# Patient Record
Sex: Male | Born: 1955 | Race: White | Hispanic: No | Marital: Single | Smoking: Former smoker
Health system: Southern US, Community
[De-identification: ages and names within clinical notes are randomized; demographics above are authoritative.]

## PROBLEM LIST (undated history)

## (undated) DIAGNOSIS — E785 Hyperlipidemia, unspecified: Secondary | ICD-10-CM

## (undated) DIAGNOSIS — K219 Gastro-esophageal reflux disease without esophagitis: Secondary | ICD-10-CM

## (undated) DIAGNOSIS — G4730 Sleep apnea, unspecified: Secondary | ICD-10-CM

## (undated) DIAGNOSIS — I10 Essential (primary) hypertension: Secondary | ICD-10-CM

## (undated) DIAGNOSIS — G4733 Obstructive sleep apnea (adult) (pediatric): Secondary | ICD-10-CM

## (undated) HISTORY — PX: UVULOPLASTY: SHX6557

## (undated) HISTORY — PX: COLONOSCOPY: SHX174

## (undated) HISTORY — PX: HERNIA REPAIR: SHX51

## (undated) HISTORY — PX: CATARACT EXTRACTION W/ INTRAOCULAR LENS  IMPLANT, BILATERAL: SHX1307

---

## 2007-01-15 ENCOUNTER — Ambulatory Visit: Payer: Self-pay | Admitting: Gastroenterology

## 2007-05-18 ENCOUNTER — Ambulatory Visit: Payer: Self-pay | Admitting: Otolaryngology

## 2007-07-29 ENCOUNTER — Ambulatory Visit: Payer: Self-pay | Admitting: Unknown Physician Specialty

## 2007-10-28 ENCOUNTER — Ambulatory Visit: Payer: Self-pay | Admitting: Otolaryngology

## 2008-11-28 ENCOUNTER — Ambulatory Visit: Payer: Self-pay | Admitting: Family Medicine

## 2009-07-11 IMAGING — NM NM PARTHYROID
2 series · 6 of 6 positions shown · non-contrast
Comparison: none

REASON FOR EXAM: Increased PTH  252.9, elevated calcium
COMMENTS:

[Series 1000: parathyroid (id) · 0.90mm/px · 3 of 3 slices shown]
[im 1/3  full-range]
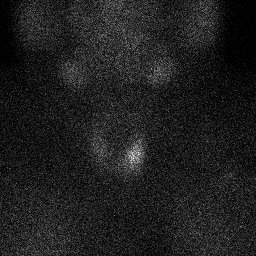
[im 2/3  full-range]
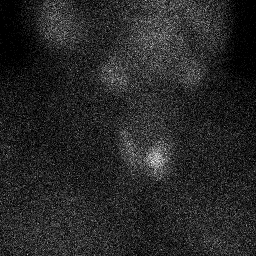
[im 3/3  full-range]
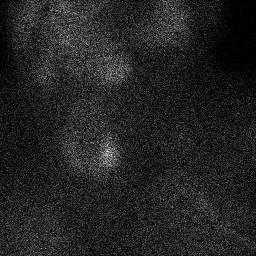

[Series 1000: parathyroid 3 hrs · 0.90mm/px · 3 of 3 slices shown]
[im 1/3  full-range]
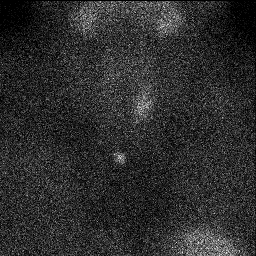
[im 2/3  full-range]
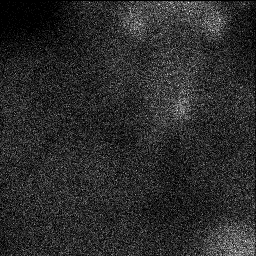
[im 3/3  full-range]
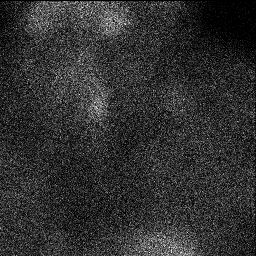

[6 of 6 positions shown; findings below may reference images not displayed]

PROCEDURE:     NM  - NM  PARATHYROID IMAGE 2 HR    [DATE] [DATE]

RESULT:     Following intravenous administration of 24.7 mCi of Technetium
99m Cardiolite, anterior images of the neck were obtained at 10 minutes and
at 3 hours. Additionally, at 3 hours a pinhole view was performed.

There is observed increased tracer activity at the lower pole of the LEFT
lobe of the thyroid. This persists throughout the exam and is consistent
with a LEFT parathyroid adenoma.
IMPRESSION: Abnormal study secondary to increased tracer activity on the
LEFT consistent with a parathyroid adenoma.

## 2010-02-04 ENCOUNTER — Emergency Department: Payer: Self-pay | Admitting: Emergency Medicine

## 2010-02-04 ENCOUNTER — Ambulatory Visit: Payer: Self-pay | Admitting: Internal Medicine

## 2011-03-02 HISTORY — PX: ROTATOR CUFF REPAIR: SHX139

## 2013-11-19 ENCOUNTER — Ambulatory Visit: Payer: Self-pay | Admitting: Gastroenterology

## 2018-03-14 ENCOUNTER — Encounter: Payer: Self-pay | Admitting: Gastroenterology

## 2019-01-20 ENCOUNTER — Telehealth: Payer: Self-pay | Admitting: Gastroenterology

## 2019-01-20 NOTE — Telephone Encounter (Signed)
Patient called & thinks it's time to schedule his colonoscopy.Please call to schedule if or call if not to let him know when he's due.

## 2019-01-22 ENCOUNTER — Encounter: Payer: Self-pay | Admitting: Gastroenterology

## 2019-01-22 ENCOUNTER — Telehealth: Payer: Self-pay

## 2019-01-22 ENCOUNTER — Other Ambulatory Visit: Payer: Self-pay

## 2019-01-22 DIAGNOSIS — Z86010 Personal history of colonic polyps: Secondary | ICD-10-CM

## 2019-01-22 NOTE — Telephone Encounter (Signed)
Gastroenterology Pre-Procedure Review Request Date: 02/24/19 Requesting Physician: Dr. Edds Norris  PATIENT REVIEW QUESTIONS: The patient responded to the following health history questions as indicated:    1. Are you having any GI issues? no 2. Do you have a personal history of Polyps? yes (2015 or 2016) 3. Do you have a family history of Colon Cancer or Polyps? yes (mother colon polyps) 4. Diabetes Mellitus? no 5. Joint replacements in the past 12 months?no 6. Major health problems in the past 3 months?no 7. Any artificial heart valves, MVP, or defibrillator?no    MEDICATIONS & ALLERGIES:    Patient reports the following regarding taking any anticoagulation/antiplatelet therapy:   Plavix, Coumadin, Eliquis, Xarelto, Lovenox, Pradaxa, Brilinta, or Effient? no Aspirin? no  Patient confirms/reports the following medications:  No current outpatient medications on file.   No current facility-administered medications for this visit.    Patient confirms/reports the following allergies:  Not on File  No orders of the defined types were placed in this encounter.   AUTHORIZATION INFORMATION Primary Insurance: 1D#: Group #:  Secondary Insurance: 1D#: Group #:  SCHEDULE INFORMATION: Date: 02/24/19 Time: Location:[facility]

## 2019-01-22 NOTE — Telephone Encounter (Signed)
Returned patients phone call.  LVM for him to call office back to schedule his colonoscopy.  Scheduling Notes: Last Colonoscopy 2015 with Dr. Butkus Norris, Colon Polyps noted.  Was due to repeat in 3 years.  New Referral placed.  Thanks [employer]

## 2019-02-18 ENCOUNTER — Encounter: Payer: Self-pay | Admitting: Gastroenterology

## 2019-02-18 ENCOUNTER — Other Ambulatory Visit: Payer: Self-pay

## 2019-02-20 ENCOUNTER — Other Ambulatory Visit
Admission: RE | Admit: 2019-02-20 | Discharge: 2019-02-20 | Disposition: A | Discharge status: Home / Self Care | Payer: 59 | Source: Ambulatory Visit | Attending: Gastroenterology | Admitting: Gastroenterology

## 2019-02-20 DIAGNOSIS — Z01812 Encounter for preprocedural laboratory examination: Secondary | ICD-10-CM | POA: Insufficient documentation

## 2019-02-20 DIAGNOSIS — Z20822 Contact with and (suspected) exposure to covid-19: Secondary | ICD-10-CM | POA: Diagnosis not present

## 2019-02-20 LAB — SARS CORONAVIRUS 2 (TAT 6-24 HRS): SARS Coronavirus 2: NEGATIVE

## 2019-02-23 NOTE — Discharge Instructions (Signed)
General Anesthesia, Adult, Care After This sheet gives you information about how to care for yourself after your procedure. Your health care provider may also give you more specific instructions. If you have problems or questions, contact your health care provider. What can I expect after the procedure? After the procedure, the following side effects are common:  Pain or discomfort at the IV site.  Nausea.  Vomiting.  Sore throat.  Trouble concentrating.  Feeling cold or chills.  Weak or tired.  Sleepiness and fatigue.  Soreness and body aches. These side effects can affect parts of the body that were not involved in surgery. Follow these instructions at home:  For at least 24 hours after the procedure:  Have a responsible adult stay with you. It is important to have someone help care for you until you are awake and alert.  Rest as needed.  Do not: ? Participate in activities in which you could fall or become injured. ? Drive. ? Use heavy machinery. ? Drink alcohol. ? Take sleeping pills or medicines that cause drowsiness. ? Make important decisions or sign legal documents. ? Take care of children on your own. Eating and drinking  Follow any instructions from your health care provider about eating or drinking restrictions.  When you feel hungry, start by eating small amounts of foods that are soft and easy to digest (bland), such as toast. Gradually return to your regular diet.  Drink enough fluid to keep your urine pale yellow.  If you vomit, rehydrate by drinking water, juice, or clear broth. General instructions  If you have sleep apnea, surgery and certain medicines can increase your risk for breathing problems. Follow instructions from your health care provider about wearing your sleep device: ? Anytime you are sleeping, including during daytime naps. ? While taking prescription pain medicines, sleeping medicines, or medicines that make you drowsy.  Return to  your normal activities as told by your health care provider. Ask your health care provider what activities are safe for you.  Take over-the-counter and prescription medicines only as told by your health care provider.  If you smoke, do not smoke without supervision.  Keep all follow-up visits as told by your health care provider. This is important. Contact a health care provider if:  You have nausea or vomiting that does not get better with medicine.  You cannot eat or drink without vomiting.  You have pain that does not get better with medicine.  You are unable to pass urine.  You develop a skin rash.  You have a fever.  You have redness around your IV site that gets worse. Get help right away if:  You have difficulty breathing.  You have chest pain.  You have blood in your urine or stool, or you vomit blood. Summary  After the procedure, it is common to have a sore throat or nausea. It is also common to feel tired.  Have a responsible adult stay with you for the first 24 hours after general anesthesia. It is important to have someone help care for you until you are awake and alert.  When you feel hungry, start by eating small amounts of foods that are soft and easy to digest (bland), such as toast. Gradually return to your regular diet.  Drink enough fluid to keep your urine pale yellow.  Return to your normal activities as told by your health care provider. Ask your health care provider what activities are safe for you. This information is not   intended to replace advice given to you by your health care provider. Make sure you discuss any questions you have with your health care provider. Document Revised: 12/21/2016 Document Reviewed: 08/03/2016 Elsevier Patient Education  2020 [employer]vier Inc[employer]  

## 2019-02-24 ENCOUNTER — Other Ambulatory Visit: Payer: Self-pay

## 2019-02-24 ENCOUNTER — Ambulatory Visit: Payer: 59 | Admitting: Anesthesiology

## 2019-02-24 ENCOUNTER — Encounter: Payer: Self-pay | Admitting: Gastroenterology

## 2019-02-24 ENCOUNTER — Encounter
Admission: RE | Disposition: A | Discharge status: Home / Self Care | Payer: Self-pay | Source: Home / Self Care | Attending: Gastroenterology

## 2019-02-24 ENCOUNTER — Ambulatory Visit
Admission: RE | Admit: 2019-02-24 | Discharge: 2019-02-24 | Disposition: A | Discharge status: Home / Self Care | Payer: 59 | Attending: Gastroenterology | Admitting: Gastroenterology

## 2019-02-24 DIAGNOSIS — Z87891 Personal history of nicotine dependence: Secondary | ICD-10-CM | POA: Diagnosis not present

## 2019-02-24 DIAGNOSIS — K635 Polyp of colon: Secondary | ICD-10-CM | POA: Diagnosis not present

## 2019-02-24 DIAGNOSIS — Z7982 Long term (current) use of aspirin: Secondary | ICD-10-CM | POA: Diagnosis not present

## 2019-02-24 DIAGNOSIS — Z79899 Other long term (current) drug therapy: Secondary | ICD-10-CM | POA: Diagnosis not present

## 2019-02-24 DIAGNOSIS — K5730 Diverticulosis of large intestine without perforation or abscess without bleeding: Secondary | ICD-10-CM | POA: Insufficient documentation

## 2019-02-24 DIAGNOSIS — H409 Unspecified glaucoma: Secondary | ICD-10-CM | POA: Diagnosis not present

## 2019-02-24 DIAGNOSIS — Z6830 Body mass index (BMI) 30.0-30.9, adult: Secondary | ICD-10-CM | POA: Diagnosis not present

## 2019-02-24 DIAGNOSIS — K219 Gastro-esophageal reflux disease without esophagitis: Secondary | ICD-10-CM | POA: Diagnosis not present

## 2019-02-24 DIAGNOSIS — G4730 Sleep apnea, unspecified: Secondary | ICD-10-CM | POA: Insufficient documentation

## 2019-02-24 DIAGNOSIS — Z860100 Personal history of colon polyps, unspecified: Secondary | ICD-10-CM

## 2019-02-24 DIAGNOSIS — I10 Essential (primary) hypertension: Secondary | ICD-10-CM | POA: Diagnosis not present

## 2019-02-24 DIAGNOSIS — D124 Benign neoplasm of descending colon: Secondary | ICD-10-CM | POA: Diagnosis not present

## 2019-02-24 DIAGNOSIS — Z1211 Encounter for screening for malignant neoplasm of colon: Secondary | ICD-10-CM | POA: Insufficient documentation

## 2019-02-24 DIAGNOSIS — K641 Second degree hemorrhoids: Secondary | ICD-10-CM | POA: Diagnosis not present

## 2019-02-24 DIAGNOSIS — D125 Benign neoplasm of sigmoid colon: Secondary | ICD-10-CM | POA: Insufficient documentation

## 2019-02-24 HISTORY — DX: Sleep apnea, unspecified: G47.30

## 2019-02-24 HISTORY — PX: POLYPECTOMY: SHX5525

## 2019-02-24 HISTORY — DX: Hyperlipidemia, unspecified: E78.5

## 2019-02-24 HISTORY — DX: Essential (primary) hypertension: I10

## 2019-02-24 HISTORY — DX: Gastro-esophageal reflux disease without esophagitis: K21.9

## 2019-02-24 HISTORY — PX: COLONOSCOPY WITH PROPOFOL: SHX5780

## 2019-02-24 SURGERY — COLONOSCOPY WITH PROPOFOL
Anesthesia: General | Site: Rectum

## 2019-02-24 MED ORDER — LIDOCAINE HCL (CARDIAC) PF 100 MG/5ML IV SOSY
PREFILLED_SYRINGE | INTRAVENOUS | Status: DC | PRN
  Administered 2019-02-24: 30 mg via INTRAVENOUS

## 2019-02-24 MED ORDER — PROPOFOL 10 MG/ML IV BOLUS
INTRAVENOUS | Status: DC | PRN
  Administered 2019-02-24: 100 mg via INTRAVENOUS

## 2019-02-24 MED ORDER — STERILE WATER FOR IRRIGATION IR SOLN
Status: DC | PRN
  Administered 2019-02-24: 50 mL

## 2019-02-24 MED ORDER — LACTATED RINGERS IV SOLN: INTRAVENOUS | Status: DC

## 2019-02-24 SURGICAL SUPPLY — 7 items
CANISTER SUCT 1200ML W/VALVE (MISCELLANEOUS) ×3 IMPLANT
GOWN CVR UNV OPN BCK APRN NK (MISCELLANEOUS) ×2 IMPLANT
GOWN ISOL THUMB LOOP REG UNIV (MISCELLANEOUS) ×4
KIT ENDO PROCEDURE OLY (KITS) ×3 IMPLANT
SNARE SHORT THROW 13M SML OVAL (MISCELLANEOUS) ×2 IMPLANT
TRAP ETRAP POLY (MISCELLANEOUS) ×2 IMPLANT
WATER STERILE IRR 250ML POUR (IV SOLUTION) ×3 IMPLANT

## 2019-02-24 NOTE — Transfer of Care (Signed)
Immediate Anesthesia Transfer of Care Note  Patient: Alexander Brewer  Procedure(s) Performed: COLONOSCOPY WITH BIOPSY (N/A Rectum) POLYPECTOMY (N/A Rectum)  Patient Location: PACU  Anesthesia Type: General  Level of Consciousness: awake, alert  and patient cooperative  Airway and Oxygen Therapy: Patient Spontanous Breathing and Patient connected to supplemental oxygen  Post-op Assessment: Post-op Vital signs reviewed, Patient's Cardiovascular Status Stable, Respiratory Function Stable, Patent Airway and No signs of Nausea or vomiting  Post-op Vital Signs: Reviewed and stable  Complications: No apparent anesthesia complications

## 2019-02-24 NOTE — Anesthesia Procedure Notes (Signed)
Performed by: Teige Rountree, CRNA Pre-anesthesia Checklist: Patient identified, Emergency Drugs available, Suction available, Timeout performed and Patient being monitored Patient Re-evaluated:Patient Re-evaluated prior to induction Oxygen Delivery Method: Nasal cannula Placement Confirmation: positive ETCO2       

## 2019-02-24 NOTE — Anesthesia Postprocedure Evaluation (Signed)
Anesthesia Post Note  Patient: Alexander Brewer  Procedure(s) Performed: COLONOSCOPY WITH BIOPSY (N/A Rectum) POLYPECTOMY (N/A Rectum)     Patient location during evaluation: PACU Anesthesia Type: General Level of consciousness: awake and alert Pain management: pain level controlled Vital Signs Assessment: post-procedure vital signs reviewed and stable Respiratory status: spontaneous breathing, nonlabored ventilation, respiratory function stable and patient connected to nasal cannula oxygen Cardiovascular status: blood pressure returned to baseline and stable Postop Assessment: no apparent nausea or vomiting Anesthetic complications: no    Matthan Sledge

## 2019-02-24 NOTE — H&P (Signed)
Alexander Lame, MD [facility] [address], [place], [place] [address] [phone] Fax : [phone]  Primary Care Physician:  Alexander Hartigan, MD Primary Gastroenterologist:  Dr. Geiger Brewer  Pre-Procedure History & Physical: HPI:  Alexander Brewer is a 64 y.o. male is here for an colonoscopy.   Past Medical History:  Diagnosis Date  . GERD (gastroesophageal reflux disease)   . Hyperlipidemia   . Hypertension   . Sleep apnea    CPAP    Past Surgical History:  Procedure Laterality Date  . CATARACT EXTRACTION W/ INTRAOCULAR LENS  IMPLANT, BILATERAL    . COLONOSCOPY    . HERNIA REPAIR    . ROTATOR CUFF REPAIR  03/2011  . UVULOPLASTY      Prior to Admission medications   Medication Sig Start Date End Date Taking? Authorizing Provider  amLODipine (NORVASC) 5 MG tablet Take 5 mg by mouth daily.   Yes [provider]  ASPIRIN 81 PO Take by mouth daily.   Yes [provider]  dorzolamide-timolol (COSOPT) 22.3-6.8 MG/ML ophthalmic solution Place 1 drop into both eyes 2 (two) times daily.   Yes [provider]  GARLIC PO Take by mouth daily.   Yes [provider]  Ixekizumab (TALTZ Turin) Inject into the skin every 30 (thirty) days.   Yes [provider]  lisinopril (ZESTRIL) 40 MG tablet Take 40 mg by mouth daily.   Yes [provider]  metoprolol tartrate (LOPRESSOR) 50 MG tablet Take 50 mg by mouth 2 (two) times daily.   Yes [provider]  naproxen sodium (ALEVE) 220 MG tablet Take 220 mg by mouth 2 (two) times daily as needed.   Yes [provider]  omeprazole (PRILOSEC) 20 MG capsule Take 20 mg by mouth daily.   Yes [provider]  Probiotic Product (PROBIOTIC PO) Take by mouth daily.   Yes [provider]  traZODone (DESYREL) 50 MG tablet Take 50 mg by mouth at bedtime as needed for sleep.   Yes [provider]  zolpidem (AMBIEN CR) 6.25 MG CR tablet Take 6.25 mg by mouth at  bedtime as needed for sleep.   Yes [provider]  Omega-3 Fatty Acids (OMEGA-3 FISH OIL PO) Take by mouth daily.    [provider]  sildenafil (VIAGRA) 100 MG tablet Take 100 mg by mouth daily as needed for erectile dysfunction.    [provider]    Allergies as of 01/22/2019  . (Not on File)    History reviewed. No pertinent family history.  Social History   Socioeconomic History  . Marital status: Single    Spouse name: Not on file  . Number of children: Not on file  . Years of education: Not on file  . Highest education level: Not on file  Occupational History  . Not on file  Tobacco Use  . Smoking status: Former Smoker    Quit date: 1990    Years since quitting: 31.1  . Smokeless tobacco: Never Used  Substance and Sexual Activity  . Alcohol use: Not Currently  . Drug use: Not on file  . Sexual activity: Not on file  Other Topics Concern  . Not on file  Social History Narrative  . Not on file   Social Determinants of Health   Financial Resource Strain:   . Difficulty of Paying Living Expenses: Not on file  Food Insecurity:   . Worried About Charity fundraiser in the Last Year:  Not on file  . Ran Out of Food in the Last Year: Not on file  Transportation Needs:   . Lack of Transportation (Medical): Not on file  . Lack of Transportation (Non-Medical): Not on file  Physical Activity:   . Days of Exercise per Week: Not on file  . Minutes of Exercise per Session: Not on file  Stress:   . Feeling of Stress : Not on file  Social Connections:   . Frequency of Communication with Friends and Family: Not on file  . Frequency of Social Gatherings with Friends and Family: Not on file  . Attends Religious Services: Not on file  . Active Member of Clubs or Organizations: Not on file  . Attends Archivist Meetings: Not on file  . Marital Status: Not on file  Intimate Partner Violence:   . Fear of Current or Ex-Partner: Not on file    . Emotionally Abused: Not on file  . Physically Abused: Not on file  . Sexually Abused: Not on file    Review of Systems: See HPI, otherwise negative ROS  Physical Exam: Ht 6' (1.829 m)   Wt 102.5 kg   BMI 30.65 kg/m  General:   Alert,  pleasant and cooperative in NAD Head:  Normocephalic and atraumatic. Neck:  Supple; no masses or thyromegaly. Lungs:  Clear throughout to auscultation.    Heart:  Regular rate and rhythm. Abdomen:  Soft, nontender and nondistended. Normal bowel sounds, without guarding, and without rebound.   Neurologic:  Alert and  oriented x4;  grossly normal neurologically.  Impression/Plan: Alexander Brewer is here for an colonoscopy to be performed for history of adenomaotus polyps last colonoscopy 11/2013  Risks, benefits, limitations, and alternatives regarding  colonoscopy have been reviewed with the patient.  Questions have been answered.  All parties agreeable.   Alexander Lame, MD  02/24/2019, 9:17 AM

## 2019-02-24 NOTE — Op Note (Signed)
[facility] Gastroenterology Patient Name: Alexander Brewer Procedure Date: 02/24/2019 9:52 AM MRN: [id number] Account #: [id number] Date of Birth: 10-06-1955 Admit Type: Outpatient Age: 64 Room: [facility] OR ROOM 01 Gender: Male Note Status: Finalized Procedure:             Colonoscopy Indications:           High risk colon cancer surveillance: Personal history                         of colonic polyps Providers:             Lucilla Lame MD, MD Referring MD:          Sofie Hartigan (Referring MD) Medicines:             Propofol per Anesthesia Complications:         No immediate complications. Procedure:             Pre-Anesthesia Assessment:                        - Prior to the procedure, a History and Physical was                         performed, and patient medications and allergies were                         reviewed. The patient's tolerance of previous                         anesthesia was also reviewed. The risks and benefits                         of the procedure and the sedation options and risks                         were discussed with the patient. All questions were                         answered, and informed consent was obtained. Prior                         Anticoagulants: The patient has taken no previous                         anticoagulant or antiplatelet agents. ASA Grade                         Assessment: II - A patient with mild systemic disease.                         After reviewing the risks and benefits, the patient                         was deemed in satisfactory condition to undergo the                         procedure.  After obtaining informed consent, the colonoscope was                         passed under direct vision. Throughout the procedure,                         the patient's blood pressure, pulse, and oxygen                         saturations were monitored continuously. The was               introduced through the anus and advanced to the the                         cecum, identified by appendiceal orifice and ileocecal                         valve. The colonoscopy was performed without                         difficulty. The patient tolerated the procedure well.                         The quality of the bowel preparation was excellent. Findings:      The perianal and digital rectal examinations were normal.      A 6 mm polyp was found in the descending colon. The polyp was sessile.       The polyp was removed with a cold snare. Resection and retrieval were       complete.      Four sessile polyps were found in the sigmoid colon. The polyps were 4       to 7 mm in size. These polyps were removed with a cold snare. Resection       and retrieval were complete.      Multiple small-mouthed diverticula were found in the entire colon.      Non-bleeding internal hemorrhoids were found during retroflexion. The       hemorrhoids were Grade II (internal hemorrhoids that prolapse but reduce       spontaneously). Impression:            - One 6 mm polyp in the descending colon, removed with                         a cold snare. Resected and retrieved.                        - Four 4 to 7 mm polyps in the sigmoid colon, removed                         with a cold snare. Resected and retrieved.                        - Diverticulosis in the entire examined colon.                        - Non-bleeding internal hemorrhoids. Recommendation:        - Discharge patient to home.                        -  Resume previous diet.                        - Continue present medications.                        - Await pathology results.                        - Repeat colonoscopy in 5 years for surveillance. Procedure Code(s):     --- Professional ---                        [phone], Colonoscopy, flexible; with removal of                         tumor(s), polyp(s), or other lesion(s) by snare                          technique Diagnosis Code(s):     --- Professional ---                        Z86.010, Personal history of colonic polyps                        K63.5, Polyp of colon CPT copyright 2019 American Medical Association. All rights reserved. The codes documented in this report are preliminary and upon coder review may  be revised to meet current compliance requirements. Lucilla Lame MD, MD 02/24/2019 10:19:58 AM This report has been signed electronically. Number of Addenda: 0 Note Initiated On: 02/24/2019 9:52 AM Scope Withdrawal Time: 0 hours 11 minutes 50 seconds  Total Procedure Duration: 0 hours 13 minutes 52 seconds  Estimated Blood Loss:  Estimated blood loss: none.      [facility]

## 2019-02-24 NOTE — Anesthesia Preprocedure Evaluation (Signed)
Anesthesia Evaluation  Patient identified by MRN, date of birth, ID band Patient awake    Reviewed: NPO status   History of Anesthesia Complications Negative for: history of anesthetic complications  Airway Mallampati: II  TM Distance: >3 FB Neck ROM: full    Dental no notable dental hx.    Pulmonary sleep apnea and Continuous Positive Airway Pressure Ventilation , former smoker,    Pulmonary exam normal        Cardiovascular Exercise Tolerance: Good hypertension, Normal cardiovascular exam     Neuro/Psych Glaucoma  negative neurological ROS  negative psych ROS   GI/Hepatic Neg liver ROS, Controlled,  Endo/Other  Morbid obesity (bmi 31)  Renal/GU negative Renal ROS  negative genitourinary   Musculoskeletal psoriasis   Abdominal   Peds  Hematology negative hematology ROS (+)   Anesthesia Other Findings covid NEG.  Reproductive/Obstetrics                             Anesthesia Physical Anesthesia Plan  ASA: II  Anesthesia Plan: General   Post-op Pain Management:    Induction:   PONV Risk Score and Plan: 2 and TIVA and Propofol infusion  Airway Management Planned:   Additional Equipment:   Intra-op Plan:   Post-operative Plan:   Informed Consent: I have reviewed the patients History and Physical, chart, labs and discussed the procedure including the risks, benefits and alternatives for the proposed anesthesia with the patient or authorized representative who has indicated his/her understanding and acceptance.       Plan Discussed with: CRNA  Anesthesia Plan Comments:         Anesthesia Quick Evaluation

## 2019-02-25 ENCOUNTER — Encounter: Payer: Self-pay | Admitting: *Deleted

## 2019-02-26 ENCOUNTER — Encounter: Payer: Self-pay | Admitting: Gastroenterology

## 2019-02-26 LAB — SURGICAL PATHOLOGY

## 2021-09-19 ENCOUNTER — Encounter: Payer: Self-pay | Admitting: Oncology

## 2021-09-19 ENCOUNTER — Inpatient Hospital Stay: Payer: Medicare Other | Attending: Oncology | Admitting: Oncology

## 2021-09-19 ENCOUNTER — Inpatient Hospital Stay: Payer: Medicare Other

## 2021-09-19 VITALS — BP 145/89 | HR 56 | Temp 96.8°F | Resp 18 | Wt 221.8 lb

## 2021-09-19 DIAGNOSIS — I10 Essential (primary) hypertension: Secondary | ICD-10-CM | POA: Insufficient documentation

## 2021-09-19 DIAGNOSIS — K219 Gastro-esophageal reflux disease without esophagitis: Secondary | ICD-10-CM | POA: Diagnosis not present

## 2021-09-19 DIAGNOSIS — Z7982 Long term (current) use of aspirin: Secondary | ICD-10-CM | POA: Insufficient documentation

## 2021-09-19 DIAGNOSIS — Z8349 Family history of other endocrine, nutritional and metabolic diseases: Secondary | ICD-10-CM | POA: Insufficient documentation

## 2021-09-19 DIAGNOSIS — Z832 Family history of diseases of the blood and blood-forming organs and certain disorders involving the immune mechanism: Secondary | ICD-10-CM | POA: Insufficient documentation

## 2021-09-19 DIAGNOSIS — G4730 Sleep apnea, unspecified: Secondary | ICD-10-CM | POA: Insufficient documentation

## 2021-09-19 DIAGNOSIS — R7303 Prediabetes: Secondary | ICD-10-CM | POA: Diagnosis not present

## 2021-09-19 DIAGNOSIS — R7989 Other specified abnormal findings of blood chemistry: Secondary | ICD-10-CM | POA: Diagnosis present

## 2021-09-19 DIAGNOSIS — Z79899 Other long term (current) drug therapy: Secondary | ICD-10-CM | POA: Diagnosis not present

## 2021-09-19 DIAGNOSIS — Z87891 Personal history of nicotine dependence: Secondary | ICD-10-CM | POA: Diagnosis not present

## 2021-09-19 LAB — IRON AND TIBC
Iron: 226 ug/dL — ABNORMAL HIGH (ref 45–182)
Saturation Ratios: 89 % — ABNORMAL HIGH (ref 17.9–39.5)
TIBC: 253 ug/dL (ref 250–450)
UIBC: 27 ug/dL

## 2021-09-19 LAB — FERRITIN: Ferritin: 294 ng/mL (ref 24–336)

## 2021-09-19 NOTE — Assessment & Plan Note (Addendum)
Family history of hemochromatosis. Recommend to repeat iron panel today.  Also check hemochromatosis DNA screening panel. Recommend patient to stop vitamin C supplementation.  Avoid alcohol use.

## 2021-09-19 NOTE — Assessment & Plan Note (Signed)
Pending above workup. 

## 2021-09-19 NOTE — Progress Notes (Signed)
Hematology/Oncology Consult note [phone] [phone]         Patient Care Team: Sofie Hartigan, MD as PCP - General (Family Medicine)  REFERRING PROVIDER: Sofie Hartigan, MD   CHIEF COMPLAINTS/REASON FOR VISIT:  Evaluation of elevated ferritin.  HISTORY OF PRESENTING ILLNESS:   Alexander Brewer. is a  66 y.o.  male with PMH listed below was seen in consultation at the request of  Feldpausch, Chrissie Noa, MD  for evaluation of elevated ferritin.  Patient reports family history of hemochromatosis in her sister.  Recent blood work on 09/08/2021 showed ferritin level of 359, iron saturation 98%, TIBC 257.  Calcium 10.8.  Normal AST, ALT, alkaline phosphatase and bilirubin. Patient denies any current alcohol use.  Patient used to drink alcohol heavily in 20s.  Denies oral iron supplementation.  He takes vitamin C 500 mg twice daily for the past year and a half. Patient is prediabetic.  No abdominal pain, unintentional weight loss, bloating.  MEDICAL HISTORY:  Past Medical History:  Diagnosis Date   GERD (gastroesophageal reflux disease)    Hypertension    Sleep apnea    CPAP    SURGICAL HISTORY: Past Surgical History:  Procedure Laterality Date   CATARACT EXTRACTION W/ INTRAOCULAR LENS  IMPLANT, BILATERAL     COLONOSCOPY     COLONOSCOPY WITH PROPOFOL N/A 02/24/2019   Procedure: COLONOSCOPY WITH BIOPSY;  Surgeon: Lucilla Lame, MD;  Location: [place];  Service: Endoscopy;  Laterality: N/A;  Sleep apnea Priority 4   HERNIA REPAIR     POLYPECTOMY N/A 02/24/2019   Procedure: POLYPECTOMY;  Surgeon: Lucilla Lame, MD;  Location: [place];  Service: Endoscopy;  Laterality: N/A;   ROTATOR CUFF REPAIR  03/2011   UVULOPLASTY      SOCIAL HISTORY: Social History   Socioeconomic History   Marital status: Single    Spouse name: Not on file   Number of children: Not on file   Years of education: Not on file   Highest education level:  Not on file  Occupational History   Not on file  Tobacco Use   Smoking status: Former    Packs/day: 0.50    Years: 3.00    Total pack years: 1.50    Types: Cigarettes    Quit date: 64    Years since quitting: 33.7   Smokeless tobacco: Never  Vaping Use   Vaping Use: Never used  Substance and Sexual Activity   Alcohol use: Not Currently   Drug use: Never   Sexual activity: Not on file  Other Topics Concern   Not on file  Social History Narrative   Not on file   Social Determinants of Health   Financial Resource Strain: Not on file  Food Insecurity: Not on file  Transportation Needs: Not on file  Physical Activity: Not on file  Stress: Not on file  Social Connections: Not on file  Intimate Partner Violence: Not on file    FAMILY HISTORY: Family History  Problem Relation Age of Onset   Breast cancer Mother    Heart failure Mother    Breast cancer Sister     ALLERGIES:  has No Known Allergies.  MEDICATIONS:  Current Outpatient Medications  Medication Sig Dispense Refill   amLODipine (NORVASC) 5 MG tablet Take 5 mg by mouth daily.     ASPIRIN 81 PO Take by mouth daily.     atorvastatin (LIPITOR) 10 MG tablet Take 10 mg by mouth  daily.     dorzolamide-timolol (COSOPT) 22.3-6.8 MG/ML ophthalmic solution Place 1 drop into both eyes 2 (two) times daily.     GARLIC PO Take by mouth daily.     Ixekizumab (TALTZ [place]) Inject into the skin every 30 (thirty) days.     lisinopril (ZESTRIL) 40 MG tablet Take 40 mg by mouth daily.     melatonin 5 MG TABS Take 5 mg by mouth.     metoprolol tartrate (LOPRESSOR) 50 MG tablet Take 50 mg by mouth 2 (two) times daily.     naproxen sodium (ALEVE) 220 MG tablet Take 220 mg by mouth 2 (two) times daily as needed.     omeprazole (PRILOSEC) 20 MG capsule Take 20 mg by mouth daily.     sildenafil (VIAGRA) 100 MG tablet Take 100 mg by mouth daily as needed for erectile dysfunction.     TURMERIC CURCUMIN PO Take by mouth.     zolpidem  (AMBIEN CR) 6.25 MG CR tablet Take 6.25 mg by mouth at bedtime as needed for sleep.     No current facility-administered medications for this visit.    Review of Systems  Constitutional:  Negative for appetite change, chills, fatigue, fever and unexpected weight change.  HENT:   Negative for hearing loss and voice change.   Eyes:  Negative for eye problems and icterus.  Respiratory:  Negative for chest tightness, cough and shortness of breath.   Cardiovascular:  Negative for chest pain and leg swelling.  Gastrointestinal:  Negative for abdominal distention and abdominal pain.  Endocrine: Negative for hot flashes.  Genitourinary:  Negative for difficulty urinating, dysuria and frequency.   Musculoskeletal:  Negative for arthralgias.  Skin:  Negative for itching and rash.  Neurological:  Negative for light-headedness and numbness.  Hematological:  Negative for adenopathy. Does not bruise/bleed easily.  Psychiatric/Behavioral:  Negative for confusion.    PHYSICAL EXAMINATION: ECOG PERFORMANCE STATUS: 0 - Asymptomatic Vitals:   09/19/21 1045  BP: (!) 145/89  Pulse: (!) 56  Resp: 18  Temp: (!) 96.8 F (36 C)   Filed Weights   09/19/21 1045  Weight: 221 lb 12.8 oz (100.6 kg)    Physical Exam Constitutional:      General: He is not in acute distress. HENT:     Head: Normocephalic and atraumatic.  Eyes:     General: No scleral icterus. Cardiovascular:     Rate and Rhythm: Normal rate and regular rhythm.     Heart sounds: Normal heart sounds.  Pulmonary:     Effort: Pulmonary effort is normal. No respiratory distress.     Breath sounds: No wheezing.  Abdominal:     General: Bowel sounds are normal. There is no distension.     Palpations: Abdomen is soft.  Musculoskeletal:        General: No deformity. Normal range of motion.     Cervical back: Normal range of motion and neck supple.  Skin:    General: Skin is warm and dry.     Findings: No erythema or rash.   Neurological:     Mental Status: He is alert and oriented to person, place, and time. Mental status is at baseline.     Cranial Nerves: No cranial nerve deficit.     Coordination: Coordination normal.  Psychiatric:        Mood and Affect: Mood normal.     LABORATORY DATA:  I have reviewed the data as listed     No data  to display             No data to display         09/14/2021, hepatitis panel negative.   RADIOGRAPHIC STUDIES: I have personally reviewed the radiological images as listed and agreed with the findings in the report. No results found.     ASSESSMENT & PLAN:   Elevated ferritin Family history of hemochromatosis. Recommend to repeat iron panel today.  Also check hemochromatosis DNA screening panel. Recommend patient to stop vitamin C supplementation.  Family history of hemochromatosis Pending above work-up.   Orders Placed This Encounter  Procedures   Ferritin    Standing Status:   Future    Number of Occurrences:   1    Standing Expiration Date:   03/20/2022   Iron and TIBC    Standing Status:   Future    Number of Occurrences:   1    Standing Expiration Date:   09/20/2022   Hemochromatosis DNA-PCR(c282y,h63d)    Standing Status:   Future    Number of Occurrences:   1    Standing Expiration Date:   09/20/2022    All questions were answered. The patient knows to call the clinic with any problems, questions or concerns.  Sofie Hartigan, MD   Return of visit: 3 to 4 weeks to go over results. Thank you for this kind referral and the opportunity to participate in the care of this patient. A copy of today's note is routed to referring provider   Earlie Server, MD, PhD [facility] Hematology Oncology 09/19/2021

## 2021-09-25 LAB — HEMOCHROMATOSIS DNA-PCR(C282Y,H63D)

## 2021-09-27 ENCOUNTER — Telehealth: Payer: Self-pay

## 2021-09-27 DIAGNOSIS — R7989 Other specified abnormal findings of blood chemistry: Secondary | ICD-10-CM

## 2021-09-27 DIAGNOSIS — Z8349 Family history of other endocrine, nutritional and metabolic diseases: Secondary | ICD-10-CM

## 2021-09-27 NOTE — Telephone Encounter (Signed)
-----   Message from Earlie Server, MD sent at 09/26/2021  4:10 PM EDT ----- Lab results positive for hemachromatosis, a condition that his body absorbs too much iron. Please let him know that I recommend abdomen US RUQ to further evaluate liver condition. Please add phelbotomy when he sees me in Oct. Thanks.

## 2021-09-27 NOTE — Telephone Encounter (Signed)
Pt informed of plan and verbalized understanding.   Please schedule patient for [place] RUQ (to be before appt in October) and inform pt of appt.   Add phleb to appt in OCT

## 2021-10-09 ENCOUNTER — Ambulatory Visit
Admission: RE | Admit: 2021-10-09 | Discharge: 2021-10-09 | Disposition: A | Discharge status: Home / Self Care | Payer: Medicare Other | Source: Ambulatory Visit | Attending: Oncology | Admitting: Oncology

## 2021-10-09 DIAGNOSIS — Z8349 Family history of other endocrine, nutritional and metabolic diseases: Secondary | ICD-10-CM | POA: Diagnosis present

## 2021-10-09 DIAGNOSIS — R7989 Other specified abnormal findings of blood chemistry: Secondary | ICD-10-CM | POA: Insufficient documentation

## 2021-10-16 ENCOUNTER — Inpatient Hospital Stay: Payer: Medicare Other | Attending: Oncology | Admitting: Oncology

## 2021-10-16 ENCOUNTER — Encounter: Payer: Self-pay | Admitting: Oncology

## 2021-10-16 ENCOUNTER — Inpatient Hospital Stay: Payer: Medicare Other

## 2021-10-16 VITALS — BP 147/96 | HR 53 | Temp 97.0°F | Resp 18 | Wt 223.0 lb

## 2021-10-16 DIAGNOSIS — Z8349 Family history of other endocrine, nutritional and metabolic diseases: Secondary | ICD-10-CM

## 2021-10-16 DIAGNOSIS — K760 Fatty (change of) liver, not elsewhere classified: Secondary | ICD-10-CM | POA: Diagnosis not present

## 2021-10-16 DIAGNOSIS — R7989 Other specified abnormal findings of blood chemistry: Secondary | ICD-10-CM | POA: Diagnosis not present

## 2021-10-16 NOTE — Progress Notes (Signed)
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  Advised patient to have first-degree relative screening for hemochromatosis.  Avoid alcohol consumption.  Avoid uncooked seafood. Patient voices understanding.      Elevated ferritin Iron saturation is persistently elevated.  Ferritin 200s. Recommend empiric phlebotomy 536m x 1  Fatty liver Ultrasound results were reviewed and discussed with patient.  Encourage lifestyle modification.  Increase exercise, healthy diet   Orders Placed This Encounter  Procedures   Ferritin    Standing Status:   Future    Standing Expiration Date:   10/17/2022   Iron and TIBC    Standing Status:   Future    Standing Expiration Date:   10/17/2022   CBC with Differential/Platelet    Standing Status:   Future    Standing Expiration Date:   10/17/2022   Hepatic function panel    Standing Status:   Future    Standing Expiration Date:   10/16/2022   Follow-up in 3 months All questions were answered. The patient knows to call the clinic with any problems, questions or concerns.  ZEarlie Server MD, PhD [facility] On[facility] 10/16/2021    CHIEF COMPLAINTS/REASON FOR VISIT:  Follow-up for iron overload, homozygous hemochromatosis  HISTORY OF PRESENTING ILLNESS:   Alexander Brewer is a  66y.o.  male with PMH listed below was seen in consultation at the request of  Feldpausch, DChrissie Noa MD  for evaluation of  iron overload, homozygous hemochromatosis  Patient reports family history of hemochromatosis in her sister.  Recent blood work on 09/08/2021 showed ferritin level of 359, iron saturation 98%, TIBC 257.  Calcium 10.8.  Normal AST, ALT, alkaline phosphatase and bilirubin. Patient denies any current alcohol  use.  Patient used to drink alcohol heavily in 20s.  Denies oral iron supplementation.  He takes vitamin C 500 mg twice daily for the past year and a half. Patient is prediabetic.  No abdominal pain, unintentional weight loss, bloating.  INTERVAL HISTORY Alexander Brewer is a 66y.o. male who has above history reviewed by me today presents for follow up visit for  iron overload, homozygous hemochromatosis. Patient had blood work done and present to discuss results.  He also had ultrasound abdomen done.  MEDICAL HISTORY:  Past Medical History:  Diagnosis Date   GERD (gastroesophageal reflux disease)    Hypertension    Sleep apnea    CPAP    SURGICAL HISTORY: Past Surgical History:  Procedure Laterality Date   CATARACT EXTRACTION W/ INTRAOCULAR LENS  IMPLANT, BILATERAL     COLONOSCOPY     COLONOSCOPY WITH PROPOFOL N/A 02/24/2019   Procedure: COLONOSCOPY WITH BIOPSY;  Surgeon: WLucilla Lame MD;  [place]  Service: Endoscopy;  Laterality: N/A;  Sleep apnea Priority 4   HERNIA REPAIR     POLYPECTOMY N/A 02/24/2019   Procedure: POLYPECTOMY;  Surgeon: WLucilla Lame MD;  [place]  Service: Endoscopy;  Laterality: N/A;   ROTATOR CUFF REPAIR  03/2011   UVULOPLASTY      SOCIAL HISTORY: Social History   Socioeconomic History   Marital status: Single    Spouse name: Not on file   Number of children: Not on file   Years of education: Not on file  Highest education level: Not on file  Occupational History   Not on file  Tobacco Use   Smoking status: Former    Packs/day: 0.50    Years: 3.00    Total pack years: 1.50    Types: Cigarettes    Quit date: 11    Years since quitting: 33.8   Smokeless tobacco: Never  Vaping Use   Vaping Use: Never used  Substance and Sexual Activity   Alcohol use: Not Currently   Drug use: Never   Sexual activity: Not on file  Other Topics Concern   Not on file  Social History Narrative   Not on file    Social Determinants of Health   Financial Resource Strain: Not on file  Food Insecurity: Not on file  Transportation Needs: Not on file  Physical Activity: Not on file  Stress: Not on file  Social Connections: Not on file  Intimate Partner Violence: Not on file    FAMILY HISTORY: Family History  Problem Relation Age of Onset   Breast cancer Mother    Heart failure Mother    Breast cancer Sister     ALLERGIES:  has No Known Allergies.  MEDICATIONS:  Current Outpatient Medications  Medication Sig Dispense Refill   amLODipine (NORVASC) 5 MG tablet Take 5 mg by mouth daily.     ASPIRIN 81 PO Take by mouth daily.     atorvastatin (LIPITOR) 10 MG tablet Take 10 mg by mouth daily.     dorzolamide-timolol (COSOPT) 22.3-6.8 MG/ML ophthalmic solution Place 1 drop into both eyes 2 (two) times daily.     GARLIC PO Take by mouth daily.     Ixekizumab (TALTZ [place]) Inject into the skin every 30 (thirty) days.     lisinopril (ZESTRIL) 40 MG tablet Take 40 mg by mouth daily.     melatonin 5 MG TABS Take 5 mg by mouth.     metoprolol tartrate (LOPRESSOR) 50 MG tablet Take 50 mg by mouth 2 (two) times daily.     naproxen sodium (ALEVE) 220 MG tablet Take 220 mg by mouth 2 (two) times daily as needed.     omeprazole (PRILOSEC) 20 MG capsule Take 20 mg by mouth daily.     sildenafil (VIAGRA) 100 MG tablet Take 100 mg by mouth daily as needed for erectile dysfunction.     TURMERIC CURCUMIN PO Take by mouth.     Vitamin D, Ergocalciferol, (DRISDOL) 1.25 MG (50000 UNIT) CAPS capsule Take 50,000 Units by mouth every 7 (seven) days.     zolpidem (AMBIEN CR) 6.25 MG CR tablet Take 6.25 mg by mouth at bedtime as needed for sleep.     No current facility-administered medications for this visit.    Review of Systems  Constitutional:  Negative for appetite change, chills, fatigue, fever and unexpected weight change.  HENT:   Negative for hearing loss and voice change.   Eyes:  Negative for eye  problems and icterus.  Respiratory:  Negative for chest tightness, cough and shortness of breath.   Cardiovascular:  Negative for chest pain and leg swelling.  Gastrointestinal:  Negative for abdominal distention and abdominal pain.  Endocrine: Negative for hot flashes.  Genitourinary:  Negative for difficulty urinating, dysuria and frequency.   Musculoskeletal:  Negative for arthralgias.  Skin:  Negative for itching and rash.  Neurological:  Negative for light-headedness and numbness.  Hematological:  Negative for adenopathy. Does not bruise/bleed easily.  Psychiatric/Behavioral:  Negative for confusion.  PHYSICAL EXAMINATION: ECOG PERFORMANCE STATUS: 0 - Asymptomatic Vitals:   10/16/21 1325  BP: (!) 147/96  Pulse: (!) 53  Resp: 18  Temp: (!) 97 F (36.1 C)   Filed Weights   10/16/21 1325  Weight: 223 lb (101.2 kg)    Physical Exam Constitutional:      General: He is not in acute distress. HENT:     Head: Normocephalic and atraumatic.  Eyes:     General: No scleral icterus. Cardiovascular:     Rate and Rhythm: Normal rate and regular rhythm.     Heart sounds: Normal heart sounds.  Pulmonary:     Effort: Pulmonary effort is normal. No respiratory distress.     Breath sounds: No wheezing.  Abdominal:     General: Bowel sounds are normal. There is no distension.     Palpations: Abdomen is soft.  Musculoskeletal:        General: No deformity. Normal range of motion.     Cervical back: Normal range of motion and neck supple.  Skin:    General: Skin is warm and dry.     Findings: No erythema or rash.  Neurological:     Mental Status: He is alert and oriented to person, place, and time. Mental status is at baseline.     Cranial Nerves: No cranial nerve deficit.     Coordination: Coordination normal.  Psychiatric:        Mood and Affect: Mood normal.     LABORATORY DATA:  I have reviewed the data as listed     No data to display              No data to  display          09/14/2021, hepatitis panel negative.   RADIOGRAPHIC STUDIES: I have personally reviewed the radiological images as listed and agreed with the findings in the report. US Abdomen Limited RUQ (LIVER/GB)  Result Date: 10/09/2021 CLINICAL DATA:  History of hemochromatosis. EXAM: ULTRASOUND ABDOMEN LIMITED RIGHT UPPER QUADRANT COMPARISON:  None Available. FINDINGS: Gallbladder: No gallstones or wall thickening visualized. No sonographic Murphy sign noted by sonographer. Common bile duct: Diameter: 4 mm Liver: Increased echogenicity. No focal lesion. Portal vein is patent on color Doppler imaging with normal direction of blood flow towards the liver. Other: None. IMPRESSION: 1. Increased hepatic parenchymal echogenicity suggestive of steatosis. 2. No cholelithiasis or sonographic evidence for acute cholecystitis. Electronically Signed   By: Lovey Newcomer M.D.   On: 10/09/2021 09:03

## 2021-10-16 NOTE — Assessment & Plan Note (Signed)
Ultrasound results were reviewed and discussed with patient.  Encourage lifestyle modification.  Increase exercise, healthy diet 

## 2021-10-16 NOTE — Patient Instructions (Signed)

## 2021-10-16 NOTE — Assessment & Plan Note (Signed)
Diagnosis of hereditary hemochromatosis discussed with patient.  Advised patient to have first-degree relative screening for hemochromatosis.  Avoid alcohol consumption.  Avoid uncooked seafood. Patient voices understanding.

## 2021-10-16 NOTE — Assessment & Plan Note (Signed)
Iron saturation is persistently elevated.  Ferritin 200s. Recommend empiric phlebotomy 558m x 1

## 2021-10-16 NOTE — Progress Notes (Signed)
Performed therapeutic phlebotomy. Removed 500 ml of blood after explaining procedure and possible side effects to patient. He tolerated procedure well. VSS. Feeling fine at time of discharge.

## 2021-12-04 ENCOUNTER — Encounter: Payer: Self-pay | Admitting: Oncology

## 2021-12-15 ENCOUNTER — Inpatient Hospital Stay: Payer: Medicare Other | Attending: Oncology

## 2021-12-15 DIAGNOSIS — Z803 Family history of malignant neoplasm of breast: Secondary | ICD-10-CM | POA: Diagnosis not present

## 2021-12-15 DIAGNOSIS — Z87891 Personal history of nicotine dependence: Secondary | ICD-10-CM | POA: Diagnosis not present

## 2021-12-15 DIAGNOSIS — Z7982 Long term (current) use of aspirin: Secondary | ICD-10-CM | POA: Insufficient documentation

## 2021-12-15 DIAGNOSIS — R7989 Other specified abnormal findings of blood chemistry: Secondary | ICD-10-CM

## 2021-12-15 DIAGNOSIS — K760 Fatty (change of) liver, not elsewhere classified: Secondary | ICD-10-CM | POA: Diagnosis not present

## 2021-12-15 DIAGNOSIS — I10 Essential (primary) hypertension: Secondary | ICD-10-CM | POA: Diagnosis not present

## 2021-12-15 DIAGNOSIS — Z79899 Other long term (current) drug therapy: Secondary | ICD-10-CM | POA: Insufficient documentation

## 2021-12-15 LAB — CBC WITH DIFFERENTIAL/PLATELET
Abs Immature Granulocytes: 0.01 10*3/uL (ref 0.00–0.07)
Basophils Absolute: 0.1 10*3/uL (ref 0.0–0.1)
Basophils Relative: 1 %
Eosinophils Absolute: 0.2 10*3/uL (ref 0.0–0.5)
Eosinophils Relative: 4 %
HCT: 48.3 % (ref 39.0–52.0)
Hemoglobin: 16.7 g/dL (ref 13.0–17.0)
Immature Granulocytes: 0 %
Lymphocytes Relative: 27 %
Lymphs Abs: 1.6 10*3/uL (ref 0.7–4.0)
MCH: 31.4 pg (ref 26.0–34.0)
MCHC: 34.6 g/dL (ref 30.0–36.0)
MCV: 90.8 fL (ref 80.0–100.0)
Monocytes Absolute: 0.5 10*3/uL (ref 0.1–1.0)
Monocytes Relative: 9 %
Neutro Abs: 3.5 10*3/uL (ref 1.7–7.7)
Neutrophils Relative %: 59 %
Platelets: 222 10*3/uL (ref 150–400)
RBC: 5.32 MIL/uL (ref 4.22–5.81)
RDW: 11.7 % (ref 11.5–15.5)
WBC: 6 10*3/uL (ref 4.0–10.5)
nRBC: 0 % (ref 0.0–0.2)

## 2021-12-15 LAB — HEPATIC FUNCTION PANEL
ALT: 37 U/L (ref 0–44)
AST: 23 U/L (ref 15–41)
Albumin: 4.3 g/dL (ref 3.5–5.0)
Alkaline Phosphatase: 79 U/L (ref 38–126)
Bilirubin, Direct: <0.1 mg/dL (ref 0.0–0.2)
Total Bilirubin: 0.6 mg/dL (ref 0.3–1.2)
Total Protein: 7.7 g/dL (ref 6.5–8.1)

## 2021-12-15 LAB — IRON AND TIBC
Iron: 190 ug/dL — ABNORMAL HIGH (ref 45–182)
Saturation Ratios: 66 % — ABNORMAL HIGH (ref 17.9–39.5)
TIBC: 288 ug/dL (ref 250–450)
UIBC: 98 ug/dL

## 2021-12-15 LAB — FERRITIN: Ferritin: 217 ng/mL (ref 24–336)

## 2021-12-18 ENCOUNTER — Encounter: Payer: Self-pay | Admitting: Oncology

## 2021-12-18 ENCOUNTER — Inpatient Hospital Stay (HOSPITAL_BASED_OUTPATIENT_CLINIC_OR_DEPARTMENT_OTHER): Payer: Medicare Other | Admitting: Oncology

## 2021-12-18 ENCOUNTER — Inpatient Hospital Stay: Payer: Medicare Other

## 2021-12-18 DIAGNOSIS — K760 Fatty (change of) liver, not elsewhere classified: Secondary | ICD-10-CM | POA: Diagnosis not present

## 2021-12-18 NOTE — Progress Notes (Unsigned)
Pt here for follow up. Pt reports that he donated 1 unit of blood on 12/16.

## 2021-12-19 ENCOUNTER — Encounter: Payer: Self-pay | Admitting: Oncology

## 2021-12-19 NOTE — Progress Notes (Signed)
Hematology/Oncology Consult note [phone] [phone]         Patient Care Team: Sofie Hartigan, MD as PCP - General (Family Medicine)  ASSESSMENT & PLAN:   Hemochromatosis, hereditary [facility]) Advised patient to have first-degree relative screening for hemochromatosis.  Avoid alcohol consumption.  Avoid uncooked seafood. US showed fatty liver disease.  Labs are reviewed and discussed with patient.  Ferritin is 217, iron saturation decreased to 66% He prefers blood donations to therapeutic phlebotomy. Discussed about ferritin goal less than 50 or 100.  Repeat labs in 4 weeks and he will donate again if ferritin is >100.   Fatty liver Ultrasound results were reviewed and discussed with patient.  Encourage lifestyle modification.  Increase exercise, healthy diet   Orders Placed This Encounter  Procedures   CBC with Differential/Platelet    Standing Status:   Future    Standing Expiration Date:   12/19/2022   Iron and TIBC    Standing Status:   Future    Standing Expiration Date:   12/19/2022   Ferritin    Standing Status:   Future    Standing Expiration Date:   12/19/2022   CBC with Differential/Platelet    Standing Status:   Future    Standing Expiration Date:   12/19/2022   Ferritin    Standing Status:   Future    Standing Expiration Date:   12/19/2022   Iron and TIBC    Standing Status:   Future    Standing Expiration Date:   12/19/2022   Follow-up in 3 months All questions were answered. The patient knows to call the clinic with any problems, questions or concerns.  Earlie Server, MD, PhD [facility] Hematology Oncology 12/18/2021    CHIEF COMPLAINTS/REASON FOR VISIT:  Follow-up for iron overload, homozygous hemochromatosis  HISTORY OF PRESENTING ILLNESS:   Alexander Ibe. is a  66 y.o.  male with PMH listed below was seen in consultation at the request of  Feldpausch, Chrissie Noa, MD  for evaluation of  iron overload, homozygous  hemochromatosis  Patient reports family history of hemochromatosis in her sister.  Recent blood work on 09/08/2021 showed ferritin level of 359, iron saturation 98%, TIBC 257.  Calcium 10.8.  Normal AST, ALT, alkaline phosphatase and bilirubin. Patient denies any current alcohol use.  Patient used to drink alcohol heavily in 20s.  Denies oral iron supplementation.  He takes vitamin C 500 mg twice daily for the past year and a half. Patient is prediabetic.  No abdominal pain, unintentional weight loss, bloating.  INTERVAL HISTORY Alexander Uy. is a 66 y.o. male who has above history reviewed by me today presents for follow up visit for  iron overload, homozygous hemochromatosis. Patient had blood work done and present to discuss results He has had blood donation 2 weeks ago.  No new complaints.   MEDICAL HISTORY:  Past Medical History:  Diagnosis Date   GERD (gastroesophageal reflux disease)    Hypertension    Sleep apnea    CPAP    SURGICAL HISTORY: Past Surgical History:  Procedure Laterality Date   CATARACT EXTRACTION W/ INTRAOCULAR LENS  IMPLANT, BILATERAL     COLONOSCOPY     COLONOSCOPY WITH PROPOFOL N/A 02/24/2019   Procedure: COLONOSCOPY WITH BIOPSY;  Surgeon: Lucilla Lame, MD;  Location: [place];  Service: Endoscopy;  Laterality: N/A;  Sleep apnea Priority 4   HERNIA REPAIR     POLYPECTOMY N/A 02/24/2019   Procedure:  POLYPECTOMY;  Surgeon: Lucilla Lame, MD;  Location: [place];  Service: Endoscopy;  Laterality: N/A;   ROTATOR CUFF REPAIR  03/2011   UVULOPLASTY      SOCIAL HISTORY: Social History   Socioeconomic History   Marital status: Single    Spouse name: Not on file   Number of children: Not on file   Years of education: Not on file   Highest education level: Not on file  Occupational History   Not on file  Tobacco Use   Smoking status: Former    Packs/day: 0.50    Years: 3.00    Total pack years: 1.50    Types: Cigarettes     Quit date: 19    Years since quitting: 33.9   Smokeless tobacco: Never  Vaping Use   Vaping Use: Never used  Substance and Sexual Activity   Alcohol use: Not Currently   Drug use: Never   Sexual activity: Not on file  Other Topics Concern   Not on file  Social History Narrative   Not on file   Social Determinants of Health   Financial Resource Strain: Not on file  Food Insecurity: Not on file  Transportation Needs: Not on file  Physical Activity: Not on file  Stress: Not on file  Social Connections: Not on file  Intimate Partner Violence: Not on file    FAMILY HISTORY: Family History  Problem Relation Age of Onset   Breast cancer Mother    Heart failure Mother    Breast cancer Sister     ALLERGIES:  has No Known Allergies.  MEDICATIONS:  Current Outpatient Medications  Medication Sig Dispense Refill   amLODipine (NORVASC) 5 MG tablet Take 5 mg by mouth daily.     ASPIRIN 81 PO Take by mouth daily.     atorvastatin (LIPITOR) 10 MG tablet Take 10 mg by mouth daily.     dorzolamide-timolol (COSOPT) 22.3-6.8 MG/ML ophthalmic solution Place 1 drop into both eyes 2 (two) times daily.     GARLIC PO Take by mouth daily.     Ixekizumab (TALTZ [place]) Inject into the skin every 30 (thirty) days.     lisinopril (ZESTRIL) 40 MG tablet Take 40 mg by mouth daily.     melatonin 5 MG TABS Take 5 mg by mouth.     metoprolol tartrate (LOPRESSOR) 50 MG tablet Take 50 mg by mouth 2 (two) times daily.     naproxen sodium (ALEVE) 220 MG tablet Take 220 mg by mouth 2 (two) times daily as needed.     omeprazole (PRILOSEC) 20 MG capsule Take 20 mg by mouth daily.     sildenafil (VIAGRA) 100 MG tablet Take 100 mg by mouth daily as needed for erectile dysfunction.     TURMERIC CURCUMIN PO Take by mouth.     Vitamin D, Ergocalciferol, (DRISDOL) 1.25 MG (50000 UNIT) CAPS capsule Take 50,000 Units by mouth every 7 (seven) days.     zolpidem (AMBIEN CR) 6.25 MG CR tablet Take 6.25 mg by mouth at  bedtime as needed for sleep.     No current facility-administered medications for this visit.    Review of Systems  Constitutional:  Negative for appetite change, chills, fatigue, fever and unexpected weight change.  HENT:   Negative for hearing loss and voice change.   Eyes:  Negative for eye problems and icterus.  Respiratory:  Negative for chest tightness, cough and shortness of breath.   Cardiovascular:  Negative for chest pain  and leg swelling.  Gastrointestinal:  Negative for abdominal distention and abdominal pain.  Endocrine: Negative for hot flashes.  Genitourinary:  Negative for difficulty urinating, dysuria and frequency.   Musculoskeletal:  Negative for arthralgias.  Skin:  Negative for itching and rash.  Neurological:  Negative for light-headedness and numbness.  Hematological:  Negative for adenopathy. Does not bruise/bleed easily.  Psychiatric/Behavioral:  Negative for confusion.    PHYSICAL EXAMINATION: ECOG PERFORMANCE STATUS: 0 - Asymptomatic Vitals:   12/18/21 1402  BP: (!) 144/87  Pulse: 73  Resp: 18  Temp: 97.9 F (36.6 C)   Filed Weights   12/18/21 1402  Weight: 227 lb 11.2 oz (103.3 kg)    Physical Exam Constitutional:      General: He is not in acute distress. HENT:     Head: Normocephalic and atraumatic.  Eyes:     General: No scleral icterus. Cardiovascular:     Rate and Rhythm: Normal rate and regular rhythm.     Heart sounds: Normal heart sounds.  Pulmonary:     Effort: Pulmonary effort is normal. No respiratory distress.     Breath sounds: No wheezing.  Abdominal:     General: Bowel sounds are normal. There is no distension.     Palpations: Abdomen is soft.  Musculoskeletal:        General: No deformity. Normal range of motion.     Cervical back: Normal range of motion and neck supple.  Skin:    General: Skin is warm and dry.     Findings: No erythema or rash.  Neurological:     Mental Status: He is alert and oriented to person,  place, and time. Mental status is at baseline.     Cranial Nerves: No cranial nerve deficit.     Coordination: Coordination normal.  Psychiatric:        Mood and Affect: Mood normal.     LABORATORY DATA:  I have reviewed the data as listed    Latest Ref Rng & Units 12/15/2021   10:43 AM  CBC  WBC 4.0 - 10.5 K/uL 6.0   Hemoglobin 13.0 - 17.0 g/dL 16.7   Hematocrit 39.0 - 52.0 % 48.3   Platelets 150 - 400 K/uL 222       Latest Ref Rng & Units 12/15/2021   10:43 AM  CMP  Total Protein 6.5 - 8.1 g/dL 7.7   Total Bilirubin 0.3 - 1.2 mg/dL 0.6   Alkaline Phos 38 - 126 U/L 79   AST 15 - 41 U/L 23   ALT 0 - 44 U/L 37    09/14/2021, hepatitis panel negative.   RADIOGRAPHIC STUDIES: I have personally reviewed the radiological images as listed and agreed with the findings in the report. US Abdomen Limited RUQ (LIVER/GB)  Result Date: 10/09/2021 CLINICAL DATA:  History of hemochromatosis. EXAM: ULTRASOUND ABDOMEN LIMITED RIGHT UPPER QUADRANT COMPARISON:  None Available. FINDINGS: Gallbladder: No gallstones or wall thickening visualized. No sonographic Murphy sign noted by sonographer. Common bile duct: Diameter: 4 mm Liver: Increased echogenicity. No focal lesion. Portal vein is patent on color Doppler imaging with normal direction of blood flow towards the liver. Other: None. IMPRESSION: 1. Increased hepatic parenchymal echogenicity suggestive of steatosis. 2. No cholelithiasis or sonographic evidence for acute cholecystitis. Electronically Signed   By: Lovey Newcomer M.D.   On: 10/09/2021 09:03

## 2021-12-19 NOTE — Assessment & Plan Note (Addendum)
Advised patient to have first-degree relative screening for hemochromatosis.  Avoid alcohol consumption.  Avoid uncooked seafood. US showed fatty liver disease.  Labs are reviewed and discussed with patient.  Ferritin is 217, iron saturation decreased to 66% He prefers blood donations to therapeutic phlebotomy. Discussed about ferritin goal less than 50 or 100.  Repeat labs in 4 weeks and he will donate again if ferritin is >100.

## 2021-12-19 NOTE — Assessment & Plan Note (Signed)
Ultrasound results were reviewed and discussed with patient.  Encourage lifestyle modification.  Increase exercise, healthy diet

## 2022-01-18 ENCOUNTER — Inpatient Hospital Stay: Payer: Medicare Other | Attending: Oncology

## 2022-01-18 LAB — CBC WITH DIFFERENTIAL/PLATELET
Abs Immature Granulocytes: 0.01 10*3/uL (ref 0.00–0.07)
Basophils Absolute: 0.1 10*3/uL (ref 0.0–0.1)
Basophils Relative: 1 %
Eosinophils Absolute: 0.2 10*3/uL (ref 0.0–0.5)
Eosinophils Relative: 3 %
HCT: 46.9 % (ref 39.0–52.0)
Hemoglobin: 15.8 g/dL (ref 13.0–17.0)
Immature Granulocytes: 0 %
Lymphocytes Relative: 35 %
Lymphs Abs: 1.9 10*3/uL (ref 0.7–4.0)
MCH: 31 pg (ref 26.0–34.0)
MCHC: 33.7 g/dL (ref 30.0–36.0)
MCV: 92.1 fL (ref 80.0–100.0)
Monocytes Absolute: 0.6 10*3/uL (ref 0.1–1.0)
Monocytes Relative: 11 %
Neutro Abs: 2.8 10*3/uL (ref 1.7–7.7)
Neutrophils Relative %: 50 %
Platelets: 258 10*3/uL (ref 150–400)
RBC: 5.09 MIL/uL (ref 4.22–5.81)
RDW: 11.5 % (ref 11.5–15.5)
WBC: 5.5 10*3/uL (ref 4.0–10.5)
nRBC: 0 % (ref 0.0–0.2)

## 2022-01-18 LAB — IRON AND TIBC
Iron: 205 ug/dL — ABNORMAL HIGH (ref 45–182)
Saturation Ratios: 79 % — ABNORMAL HIGH (ref 17.9–39.5)
TIBC: 260 ug/dL (ref 250–450)
UIBC: 55 ug/dL

## 2022-01-18 LAB — FERRITIN: Ferritin: 157 ng/mL (ref 24–336)

## 2022-03-16 ENCOUNTER — Inpatient Hospital Stay: Payer: Medicare Other | Attending: Oncology

## 2022-03-16 DIAGNOSIS — K760 Fatty (change of) liver, not elsewhere classified: Secondary | ICD-10-CM | POA: Insufficient documentation

## 2022-03-16 LAB — FERRITIN: Ferritin: 136 ng/mL (ref 24–336)

## 2022-03-16 LAB — CBC WITH DIFFERENTIAL/PLATELET
Abs Immature Granulocytes: 0.01 10*3/uL (ref 0.00–0.07)
Basophils Absolute: 0.1 10*3/uL (ref 0.0–0.1)
Basophils Relative: 1 %
Eosinophils Absolute: 0.3 10*3/uL (ref 0.0–0.5)
Eosinophils Relative: 4 %
HCT: 42.6 % (ref 39.0–52.0)
Hemoglobin: 14.7 g/dL (ref 13.0–17.0)
Immature Granulocytes: 0 %
Lymphocytes Relative: 29 %
Lymphs Abs: 2 10*3/uL (ref 0.7–4.0)
MCH: 30.9 pg (ref 26.0–34.0)
MCHC: 34.5 g/dL (ref 30.0–36.0)
MCV: 89.7 fL (ref 80.0–100.0)
Monocytes Absolute: 0.8 10*3/uL (ref 0.1–1.0)
Monocytes Relative: 11 %
Neutro Abs: 3.9 10*3/uL (ref 1.7–7.7)
Neutrophils Relative %: 55 %
Platelets: 231 10*3/uL (ref 150–400)
RBC: 4.75 MIL/uL (ref 4.22–5.81)
RDW: 11.7 % (ref 11.5–15.5)
WBC: 7.1 10*3/uL (ref 4.0–10.5)
nRBC: 0 % (ref 0.0–0.2)

## 2022-03-16 LAB — IRON AND TIBC
Iron: 127 ug/dL (ref 45–182)
Saturation Ratios: 49 % — ABNORMAL HIGH (ref 17.9–39.5)
TIBC: 262 ug/dL (ref 250–450)
UIBC: 135 ug/dL

## 2022-03-19 ENCOUNTER — Encounter: Payer: Self-pay | Admitting: Oncology

## 2022-03-19 ENCOUNTER — Inpatient Hospital Stay (HOSPITAL_BASED_OUTPATIENT_CLINIC_OR_DEPARTMENT_OTHER): Payer: Medicare Other | Admitting: Oncology

## 2022-03-19 DIAGNOSIS — K760 Fatty (change of) liver, not elsewhere classified: Secondary | ICD-10-CM

## 2022-03-19 NOTE — Progress Notes (Signed)
Pt here for follow up. No new concerns voiced.   

## 2022-03-19 NOTE — Assessment & Plan Note (Addendum)
Recommend lifestyle modification.  Increase exercise, healthy diet

## 2022-03-19 NOTE — Assessment & Plan Note (Addendum)
Homozygous hemochromatosis  Advised patient to have first-degree relative screening for hemochromatosis.  Avoid alcohol consumption.  Avoid uncooked seafood. US showed fatty liver disease.  Labs are reviewed and discussed with patient.  Ferritin has decreased to 136.  Iron saturation 49 Therapeutic phlebotomy is cost prohibitive to him.  Patient elects to donate blood to blood bank. We did gust about the goal of ferritin less than 100, preferably less than 50. Avoid iron and vitamin C supplementation, food rich in iron and vitamin C.  First-degree relatives to screen for hemochromatosis genes.

## 2022-03-19 NOTE — Progress Notes (Signed)
Hematology/Oncology Consult note [phone] [phone]         Patient Care Team: Sofie Hartigan, MD as PCP - General (Family Medicine)  ASSESSMENT & PLAN:   Hemochromatosis, hereditary [facility]) Homozygous hemochromatosis  Advised patient to have first-degree relative screening for hemochromatosis.  Avoid alcohol consumption.  Avoid uncooked seafood. US showed fatty liver disease.  Labs are reviewed and discussed with patient.  Ferritin has decreased to 136.  Iron saturation 49 Therapeutic phlebotomy is cost prohibitive to him.  Patient elects to donate blood to blood bank. We did gust about the goal of ferritin less than 100, preferably less than 50. Avoid iron and vitamin C supplementation, food rich in iron and vitamin C.  First-degree relatives to screen for hemochromatosis genes.  Fatty liver Recommend lifestyle modification.  Increase exercise, healthy diet   Orders Placed This Encounter  Procedures   CBC with Differential ([place] Only)    Standing Status:   Future    Standing Expiration Date:   03/19/2023   Ferritin    Standing Status:   Future    Standing Expiration Date:   03/19/2023   Iron and TIBC    Standing Status:   Future    Standing Expiration Date:   03/19/2023   Hepatic function panel    Standing Status:   Future    Standing Expiration Date:   03/19/2023   Follow-up in 3 months All questions were answered. The patient knows to call the clinic with any problems, questions or concerns.  Earlie Server, MD, PhD [facility] Hematology Oncology 03/19/2022    CHIEF COMPLAINTS/REASON FOR VISIT:  Follow-up for iron overload, homozygous hemochromatosis  HISTORY OF PRESENTING ILLNESS:   Lucky Garces. is a  67 y.o.  male with PMH listed below was seen in consultation at the request of  Feldpausch, Chrissie Noa, MD  for evaluation of  iron overload, homozygous hemochromatosis  Patient reports family history of hemochromatosis in her  sister.  Recent blood work on 09/08/2021 showed ferritin level of 359, iron saturation 98%, TIBC 257.  Calcium 10.8.  Normal AST, ALT, alkaline phosphatase and bilirubin. Patient denies any current alcohol use.  Patient used to drink alcohol heavily in 20s.  Denies oral iron supplementation.  He takes vitamin C 500 mg twice daily for the past year and a half. Patient is prediabetic.  No abdominal pain, unintentional weight loss, bloating.  INTERVAL HISTORY Kentrel Mayle. is a 67 y.o. male who has above history reviewed by me today presents for follow up visit for  iron overload, homozygous hemochromatosis. Patient had blood work done and present to discuss results He donates blood to local blood bank. No new complaints.   MEDICAL HISTORY:  Past Medical History:  Diagnosis Date   GERD (gastroesophageal reflux disease)    Hypertension    Sleep apnea    CPAP    SURGICAL HISTORY: Past Surgical History:  Procedure Laterality Date   CATARACT EXTRACTION W/ INTRAOCULAR LENS  IMPLANT, BILATERAL     COLONOSCOPY     COLONOSCOPY WITH PROPOFOL N/A 02/24/2019   Procedure: COLONOSCOPY WITH BIOPSY;  Surgeon: Lucilla Lame, MD;  Location: [place];  Service: Endoscopy;  Laterality: N/A;  Sleep apnea Priority 4   HERNIA REPAIR     POLYPECTOMY N/A 02/24/2019   Procedure: POLYPECTOMY;  Surgeon: Lucilla Lame, MD;  Location: [place];  Service: Endoscopy;  Laterality: N/A;   ROTATOR CUFF REPAIR  03/2011  UVULOPLASTY      SOCIAL HISTORY: Social History   Socioeconomic History   Marital status: Single    Spouse name: Not on file   Number of children: Not on file   Years of education: Not on file   Highest education level: Not on file  Occupational History   Not on file  Tobacco Use   Smoking status: Former    Packs/day: 0.50    Years: 3.00    Additional pack years: 0.00    Total pack years: 1.50    Types: Cigarettes    Quit date: 7    Years since quitting: 34.2    Smokeless tobacco: Never  Vaping Use   Vaping Use: Never used  Substance and Sexual Activity   Alcohol use: Not Currently   Drug use: Never   Sexual activity: Not on file  Other Topics Concern   Not on file  Social History Narrative   Not on file   Social Determinants of Health   Financial Resource Strain: Not on file  Food Insecurity: Not on file  Transportation Needs: Not on file  Physical Activity: Not on file  Stress: Not on file  Social Connections: Not on file  Intimate Partner Violence: Not on file    FAMILY HISTORY: Family History  Problem Relation Age of Onset   Breast cancer Mother    Heart failure Mother    Breast cancer Sister     ALLERGIES:  has No Known Allergies.  MEDICATIONS:  Current Outpatient Medications  Medication Sig Dispense Refill   amLODipine (NORVASC) 5 MG tablet Take 5 mg by mouth daily.     ASPIRIN 81 PO Take by mouth daily.     atorvastatin (LIPITOR) 10 MG tablet Take 10 mg by mouth daily.     dorzolamide-timolol (COSOPT) 22.3-6.8 MG/ML ophthalmic solution Place 1 drop into both eyes 2 (two) times daily.     GARLIC PO Take by mouth daily.     Ixekizumab (TALTZ [place]) Inject into the skin every 30 (thirty) days.     lisinopril (ZESTRIL) 40 MG tablet Take 40 mg by mouth daily.     melatonin 5 MG TABS Take 5 mg by mouth.     metoprolol tartrate (LOPRESSOR) 50 MG tablet Take 50 mg by mouth 2 (two) times daily.     naproxen sodium (ALEVE) 220 MG tablet Take 220 mg by mouth 2 (two) times daily as needed.     omeprazole (PRILOSEC) 20 MG capsule Take 20 mg by mouth daily.     sildenafil (VIAGRA) 100 MG tablet Take 100 mg by mouth daily as needed for erectile dysfunction.     TURMERIC CURCUMIN PO Take by mouth.     Vitamin D, Ergocalciferol, (DRISDOL) 1.25 MG (50000 UNIT) CAPS capsule Take 50,000 Units by mouth every 7 (seven) days.     zolpidem (AMBIEN CR) 6.25 MG CR tablet Take 6.25 mg by mouth at bedtime as needed for sleep.     No current  facility-administered medications for this visit.    Review of Systems  Constitutional:  Negative for appetite change, chills, fatigue, fever and unexpected weight change.  HENT:   Negative for hearing loss and voice change.   Eyes:  Negative for eye problems and icterus.  Respiratory:  Negative for chest tightness, cough and shortness of breath.   Cardiovascular:  Negative for chest pain and leg swelling.  Gastrointestinal:  Negative for abdominal distention and abdominal pain.  Endocrine: Negative for hot flashes.  Genitourinary:  Negative for difficulty urinating, dysuria and frequency.   Musculoskeletal:  Negative for arthralgias.  Skin:  Negative for itching and rash.  Neurological:  Negative for light-headedness and numbness.  Hematological:  Negative for adenopathy. Does not bruise/bleed easily.  Psychiatric/Behavioral:  Negative for confusion.    PHYSICAL EXAMINATION: ECOG PERFORMANCE STATUS: 0 - Asymptomatic Vitals:   03/19/22 1436  BP: (!) 146/88  Pulse: 66  Resp: 18  Temp: (!) 96.8 F (36 C)   Filed Weights   03/19/22 1436  Weight: 221 lb 8 oz (100.5 kg)    Physical Exam Constitutional:      General: He is not in acute distress. HENT:     Head: Normocephalic and atraumatic.  Eyes:     General: No scleral icterus. Cardiovascular:     Rate and Rhythm: Normal rate and regular rhythm.     Heart sounds: Normal heart sounds.  Pulmonary:     Effort: Pulmonary effort is normal. No respiratory distress.     Breath sounds: No wheezing.  Abdominal:     General: Bowel sounds are normal. There is no distension.     Palpations: Abdomen is soft.  Musculoskeletal:        General: No deformity. Normal range of motion.     Cervical back: Normal range of motion and neck supple.  Skin:    General: Skin is warm and dry.     Findings: No erythema or rash.  Neurological:     Mental Status: He is alert and oriented to person, place, and time. Mental status is at baseline.      Cranial Nerves: No cranial nerve deficit.     Coordination: Coordination normal.  Psychiatric:        Mood and Affect: Mood normal.     LABORATORY DATA:  I have reviewed the data as listed    Latest Ref Rng & Units 03/16/2022    9:41 AM 01/18/2022    9:37 AM 12/15/2021   10:43 AM  CBC  WBC 4.0 - 10.5 K/uL 7.1  5.5  6.0   Hemoglobin 13.0 - 17.0 g/dL 14.7  15.8  16.7   Hematocrit 39.0 - 52.0 % 42.6  46.9  48.3   Platelets 150 - 400 K/uL 231  258  222       Latest Ref Rng & Units 12/15/2021   10:43 AM  CMP  Total Protein 6.5 - 8.1 g/dL 7.7   Total Bilirubin 0.3 - 1.2 mg/dL 0.6   Alkaline Phos 38 - 126 U/L 79   AST 15 - 41 U/L 23   ALT 0 - 44 U/L 37    09/14/2021, hepatitis panel negative.   RADIOGRAPHIC STUDIES: I have personally reviewed the radiological images as listed and agreed with the findings in the report. No results found.

## 2022-04-11 ENCOUNTER — Encounter: Payer: Self-pay | Admitting: Otolaryngology

## 2022-04-12 NOTE — Discharge Instructions (Signed)
[facility] ENDOSCOPIC SINUS SURGERY [facility]  What is Functional Endoscopic Sinus Surgery?  The Surgery involves making the natural openings of the sinuses larger by removing the bony partitions that separate the sinuses from the nasal cavity.  The natural sinus lining is preserved as much as possible to allow the sinuses to resume normal function after the surgery.  In some patients nasal polyps (excessively swollen lining of the sinuses) may be removed to relieve obstruction of the sinus openings.  The surgery is performed through the nose using lighted scopes, which eliminates the need for incisions on the face.  A septoplasty is a different procedure which is sometimes performed with sinus surgery.  It involves straightening the boy partition that separates the two sides of your nose.  A crooked or deviated septum may need repair if is obstructing the sinuses or nasal airflow.  Turbinate reduction is also often performed during sinus surgery.  The turbinates are bony proturberances from the side walls of the nose which swell and can obstruct the nose in patients with sinus and allergy problems.  Their size can be surgically reduced to help relieve nasal obstruction.  What Can Sinus Surgery Do For Me?  Sinus surgery can reduce the frequency of sinus infections requiring antibiotic treatment.  This can provide improvement in nasal congestion, post-nasal drainage, facial pressure and nasal obstruction.  Surgery will NOT prevent you from ever having an infection again, so it usually only for patients who get infections 4 or more times yearly requiring antibiotics, or for infections that do not clear with antibiotics.  It will not cure nasal allergies, so patients with allergies may still require medication to treat their allergies after surgery. Surgery may improve headaches related to sinusitis, however, some people will continue to  require medication to control sinus headaches related to allergies.  Surgery will do nothing for other forms of headache (migraine, tension or cluster).  What Are the Risks of Endoscopic Sinus Surgery?  Current techniques allow surgery to be performed safely with little risk, however, there are rare complications that patients should be aware of.  Because the sinuses are located around the eyes, there is risk of eye injury, including blindness, though again, this would be quite rare. This is usually a result of bleeding behind the eye during surgery, which can effect vision, though there are treatments to protect the vision and prevent permanent injury. More serious complications would include bleeding inside the brain cavity or damage to the brain.This happens when the fluid around the brain leaks out into the sinus cavity.  Again, all of these complications are uncommon, and spinal fluid leaks can be safely managed surgically if they occur.  The most common complication of sinus surgery is bleeding from the nose, which may require packing or cauterization of the nose.  Patients with polyps may experience recurrence of the polyps that would require revision surgery.  Alterations of sense of smell or injury to the tear ducts are also rare complications.   What is the Surgery Like, and what is the Recovery?  The Surgery usually takes a couple of hours to perform, and is usually performed under a general anesthetic (completely asleep).  Patients are usually discharged home after a couple of hours.  Sometimes during surgery it is necessary to pack the nose to control bleeding, and the packing is left in place for 24 - 48 hours, and removed by your surgeon.  If   a septoplasty was performed during the procedure, there is often a splint placed which must be removed after 5-7 days.   Discomfort: Pain is usually mild to moderate, and can be controlled by prescription pain medication or acetaminophen (Tylenol).   Aspirin, Ibuprofen (Advil, Motrin), or Naprosyn (Aleve) should be avoided, as they can cause increased bleeding.  Most patients feel sinus pressure like they have a bad head cold for several days.  Sleeping with your head elevated can help reduce swelling and facial pressure, as can ice packs over the face.  A humidifier may be helpful to keep the mucous and blood from drying in the nose.   Diet: There are no specific diet restrictions, however, you should generally start with clear liquids and a light diet of bland foods because the anesthetic can cause some nausea.  Advance your diet depending on how your stomach feels.  Taking your pain medication with food will often help reduce stomach upset which pain medications can cause.  Nasal Saline Irrigation: It is important to remove blood clots and dried mucous from the nose as it is healing.  This is done by having you irrigate the nose at least 3 - 4 times daily with a salt water solution.  We recommend using NeilMed Sinus Rinse (available at the drug store).  Fill the squeeze bottle with the solution, bend over a sink, and insert the tip of the squeeze bottle into the nose  of an inch.  Point the tip of the squeeze bottle towards the inside corner of the eye on the same side your irrigating.  Squeeze the bottle and gently irrigate the nose.  If you bend forward as you do this, most of the fluid will flow back out of the nose, instead of down your throat.   The solution should be warm, near body temperature, when you irrigate.   Each time you irrigate, you should use a full squeeze bottle.   Note that if you are instructed to use Nasal Steroid Sprays at any time after your surgery, irrigate with saline BEFORE using the steroid spray, so you do not wash it all out of the nose. Another product, Nasal Saline Gel (such as AYR Nasal Saline Gel) can be applied in each nostril 3 - 4 times daily to moisture the nose and reduce scabbing or crusting.  Bleeding:   Bloody drainage from the nose can be expected for several days, and patients are instructed to irrigate their nose frequently with salt water to help remove mucous and blood clots.  The drainage may be dark red or brown, though some fresh blood may be seen intermittently, especially after irrigation.  Do not blow you nose, as bleeding may occur. If you must sneeze, keep your mouth open to allow air to escape through your mouth.  If heavy bleeding occurs: Irrigate the nose with saline to rinse out clots, then spray the nose 3 - 4 times with Afrin Nasal Decongestant Spray.  The spray will constrict the blood vessels to slow bleeding.  Pinch the lower half of your nose shut to apply pressure, and lay down with your head elevated.  Ice packs over the nose may help as well. If bleeding persists despite these measures, you should notify your doctor.  Do not use the Afrin routinely to control nasal congestion after surgery, as it can result in worsening congestion and may affect healing.     Activity: Return to work varies among patients. Most patients will be out   of work at least 5 - 7 days to recover.  Patient may return to work after they are off of narcotic pain medication, and feeling well enough to perform the functions of their job.  Patients must avoid heavy lifting (over 10 pounds) or strenuous physical for 2 weeks after surgery, so your employer may need to assign you to light duty, or keep you out of work longer if light duty is not possible.  NOTE: you should not drive, operate dangerous machinery, do any mentally demanding tasks or make any important legal or financial decisions while on narcotic pain medication and recovering from the general anesthetic.    Call Your Doctor Immediately if You Have Any of the Following: Bleeding that you cannot control with the above measures Loss of vision, double vision, bulging of the eye or black eyes. Fever over 101 degrees Neck stiffness with severe headache,  fever, nausea and change in mental state. You are always encouraged to call anytime with concerns, however, please call with requests for pain medication refills during office hours.  Office Endoscopy: During follow-up visits your doctor will remove any packing or splints that may have been placed and evaluate and clean your sinuses endoscopically.  Topical anesthetic will be used to make this as comfortable as possible, though you may want to take your pain medication prior to the visit.  How often this will need to be done varies from patient to patient.  After complete recovery from the surgery, you may need follow-up endoscopy from time to time, particularly if there is concern of recurrent infection or nasal polyps.  

## 2022-04-19 ENCOUNTER — Ambulatory Visit: Payer: Medicare Other | Admitting: Anesthesiology

## 2022-04-19 ENCOUNTER — Other Ambulatory Visit: Payer: Self-pay

## 2022-04-19 ENCOUNTER — Ambulatory Visit
Admission: RE | Admit: 2022-04-19 | Discharge: 2022-04-19 | Disposition: A | Discharge status: Home / Self Care | Payer: Medicare Other | Attending: Otolaryngology | Admitting: Otolaryngology

## 2022-04-19 ENCOUNTER — Encounter
Admission: RE | Disposition: A | Discharge status: Home / Self Care | Payer: Self-pay | Source: Home / Self Care | Attending: Otolaryngology

## 2022-04-19 ENCOUNTER — Encounter: Payer: Self-pay | Admitting: Otolaryngology

## 2022-04-19 DIAGNOSIS — G4730 Sleep apnea, unspecified: Secondary | ICD-10-CM | POA: Diagnosis not present

## 2022-04-19 DIAGNOSIS — Z79899 Other long term (current) drug therapy: Secondary | ICD-10-CM | POA: Insufficient documentation

## 2022-04-19 DIAGNOSIS — J339 Nasal polyp, unspecified: Secondary | ICD-10-CM | POA: Insufficient documentation

## 2022-04-19 DIAGNOSIS — Z87891 Personal history of nicotine dependence: Secondary | ICD-10-CM | POA: Diagnosis not present

## 2022-04-19 DIAGNOSIS — K219 Gastro-esophageal reflux disease without esophagitis: Secondary | ICD-10-CM | POA: Insufficient documentation

## 2022-04-19 DIAGNOSIS — J321 Chronic frontal sinusitis: Secondary | ICD-10-CM | POA: Insufficient documentation

## 2022-04-19 DIAGNOSIS — J322 Chronic ethmoidal sinusitis: Secondary | ICD-10-CM | POA: Insufficient documentation

## 2022-04-19 DIAGNOSIS — J320 Chronic maxillary sinusitis: Secondary | ICD-10-CM | POA: Insufficient documentation

## 2022-04-19 DIAGNOSIS — I10 Essential (primary) hypertension: Secondary | ICD-10-CM | POA: Diagnosis not present

## 2022-04-19 HISTORY — PX: MAXILLARY ANTROSTOMY: SHX2003

## 2022-04-19 HISTORY — PX: POLYPECTOMY: SHX149

## 2022-04-19 HISTORY — PX: ETHMOIDECTOMY: SHX5197

## 2022-04-19 HISTORY — DX: Obstructive sleep apnea (adult) (pediatric): G47.33

## 2022-04-19 HISTORY — PX: IMAGE GUIDED SINUS SURGERY: SHX6570

## 2022-04-19 SURGERY — SINUS SURGERY, WITH IMAGING GUIDANCE
Anesthesia: General | Site: Nose | Laterality: Left

## 2022-04-19 MED ORDER — LACTATED RINGERS IV SOLN: INTRAVENOUS | Status: DC

## 2022-04-19 MED ORDER — SUCCINYLCHOLINE CHLORIDE 200 MG/10ML IV SOSY
PREFILLED_SYRINGE | INTRAVENOUS | Status: DC | PRN
  Administered 2022-04-19: 100 mg via INTRAVENOUS

## 2022-04-19 MED ORDER — FENTANYL CITRATE (PF) 100 MCG/2ML IJ SOLN
INTRAMUSCULAR | Status: DC | PRN
  Administered 2022-04-19: 25 ug via INTRAVENOUS
  Administered 2022-04-19: 50 ug via INTRAVENOUS
  Administered 2022-04-19: 25 ug via INTRAVENOUS

## 2022-04-19 MED ORDER — EPHEDRINE SULFATE (PRESSORS) 50 MG/ML IJ SOLN
INTRAMUSCULAR | Status: DC | PRN
  Administered 2022-04-19 (×7): 10 mg via INTRAVENOUS

## 2022-04-19 MED ORDER — MIDAZOLAM HCL 5 MG/5ML IJ SOLN
INTRAMUSCULAR | Status: DC | PRN
  Administered 2022-04-19: 2 mg via INTRAVENOUS

## 2022-04-19 MED ORDER — DEXTROSE 5 % IV SOLN
2000.0000 mg | Freq: Once | INTRAVENOUS | Status: AC
  Administered 2022-04-19: 2000 mg via INTRAVENOUS

## 2022-04-19 MED ORDER — LIDOCAINE HCL (CARDIAC) PF 100 MG/5ML IV SOSY
PREFILLED_SYRINGE | INTRAVENOUS | Status: DC | PRN
  Administered 2022-04-19: 30 mg via INTRAVENOUS

## 2022-04-19 MED ORDER — DEXAMETHASONE SODIUM PHOSPHATE 4 MG/ML IJ SOLN
INTRAMUSCULAR | Status: DC | PRN
  Administered 2022-04-19: 8 mg via INTRAVENOUS

## 2022-04-19 MED ORDER — HYDROCODONE-ACETAMINOPHEN 5-325 MG PO TABS: 1.0000 | ORAL_TABLET | Freq: Four times a day (QID) | ORAL | 0 refills | Status: AC | PRN

## 2022-04-19 MED ORDER — PREDNISONE 10 MG PO TABS: ORAL_TABLET | ORAL | 0 refills | Status: DC

## 2022-04-19 MED ORDER — OXYCODONE HCL 5 MG/5ML PO SOLN
10.0000 mg | Freq: Once | ORAL | Status: AC
  Administered 2022-04-19: 10 mg via ORAL

## 2022-04-19 MED ORDER — OXYMETAZOLINE HCL 0.05 % NA SOLN
2.0000 | Freq: Once | NASAL | Status: AC
  Administered 2022-04-19: 2 via NASAL

## 2022-04-19 MED ORDER — GLYCOPYRROLATE 0.2 MG/ML IJ SOLN
INTRAMUSCULAR | Status: DC | PRN
  Administered 2022-04-19: .2 mg via INTRAVENOUS

## 2022-04-19 MED ORDER — ONDANSETRON HCL 4 MG/2ML IJ SOLN
INTRAMUSCULAR | Status: DC | PRN
  Administered 2022-04-19: 4 mg via INTRAVENOUS

## 2022-04-19 MED ORDER — LIDOCAINE-EPINEPHRINE 1 %-1:100000 IJ SOLN
INTRAMUSCULAR | Status: DC | PRN
  Administered 2022-04-19: 3 mL

## 2022-04-19 MED ORDER — PHENYLEPHRINE HCL 0.5 % NA SOLN
NASAL | Status: DC | PRN
  Administered 2022-04-19: 15 mL via TOPICAL

## 2022-04-19 MED ORDER — PROPOFOL 10 MG/ML IV BOLUS
INTRAVENOUS | Status: DC | PRN
  Administered 2022-04-19: 150 mg via INTRAVENOUS

## 2022-04-19 SURGICAL SUPPLY — 31 items
BLADE SHAVER TRUDI STR 4 (ENT DISPOSABLE) ×2 IMPLANT
CABLE TRUDI DISPOSABLE (ENT DISPOSABLE) ×4 IMPLANT
CANISTER SUCT 1200ML W/VALVE (MISCELLANEOUS) ×2 IMPLANT
CATH IV 18X1 1/4 SAFELET (CATHETERS) ×2 IMPLANT
COAGULATOR SUCT 8FR VV (MISCELLANEOUS) ×2 IMPLANT
DEVICE INFLATION SEID (MISCELLANEOUS) IMPLANT
ELECT REM PT RETURN 9FT ADLT (ELECTROSURGICAL) ×2
ELECTRODE REM PT RTRN 9FT ADLT (ELECTROSURGICAL) ×2 IMPLANT
GLOVE SURG GAMMEX PI TX LF 7.5 (GLOVE) ×4 IMPLANT
GOWN STRL REUS W/ TWL LRG LVL3 (GOWN DISPOSABLE) ×2 IMPLANT
GOWN STRL REUS W/TWL LRG LVL3 (GOWN DISPOSABLE) ×2
IV CATH 18X1 1/4 SAFELET (CATHETERS) ×2
IV NS 500ML (IV SOLUTION) ×2
IV NS 500ML BAXH (IV SOLUTION) ×2 IMPLANT
KIT TURNOVER KIT A (KITS) ×2 IMPLANT
NDL ANESTHESIA 27G X 3.5 (NEEDLE) ×2 IMPLANT
NDL HYPO 27GX1-1/4 (NEEDLE) ×2 IMPLANT
NEEDLE ANESTHESIA  27G X 3.5 (NEEDLE) ×2
NEEDLE ANESTHESIA 27G X 3.5 (NEEDLE) ×2 IMPLANT
NEEDLE HYPO 27GX1-1/4 (NEEDLE) ×2 IMPLANT
NS IRRIG 500ML POUR BTL (IV SOLUTION) ×2 IMPLANT
PACK ENT CUSTOM (PACKS) ×2 IMPLANT
PACKING NASAL EPIS 4X2.4 XEROG (MISCELLANEOUS) ×4 IMPLANT
PATTIES SURGICAL .5 X3 (DISPOSABLE) ×2 IMPLANT
STRAP BODY AND KNEE 60X3 (MISCELLANEOUS) ×2 IMPLANT
SYR 3ML LL SCALE MARK (SYRINGE) ×2 IMPLANT
SYSTEM BALLOON SINUPLASTY 6X16 (SINUPLASTY) IMPLANT
TOWEL OR 17X26 4PK STRL BLUE (TOWEL DISPOSABLE) ×2 IMPLANT
TRACKER DISPOSABLE PAITIENT (MISCELLANEOUS) ×2 IMPLANT
TUBING IRRIGATION BIEN-AIR (TUBING) ×2 IMPLANT
WATER STERILE IRR 250ML POUR (IV SOLUTION) ×2 IMPLANT

## 2022-04-19 NOTE — H&P (Signed)
H&P has been reviewed and patient reevaluated, no changes necessary. To be downloaded later.  

## 2022-04-19 NOTE — Transfer of Care (Signed)
Immediate Anesthesia Transfer of Care Note  Patient: Alexander Brewer.  Procedure(s) Performed: IMAGE GUIDED SINUS SURGERY (Bilateral: Nose) MAXILLARY ANTROSTOMY WITH REMOVAL CONTENTS (Bilateral: Nose) ENDOSCOPIC POLYPECTOMY NASAL (Left: Nose) ENDOSCOPIC TOTAL ETHMOIDECTOMY WITH FRONTAL SINUSOTOMY (Bilateral: Nose)  Patient Location: PACU  Anesthesia Type: General ETT  Level of Consciousness: awake, alert  and patient cooperative  Airway and Oxygen Therapy: Patient Spontanous Breathing and Patient connected to supplemental oxygen  Post-op Assessment: Post-op Vital signs reviewed, Patient's Cardiovascular Status Stable, Respiratory Function Stable, Patent Airway and No signs of Nausea or vomiting  Post-op Vital Signs: Reviewed and stable  Complications: No notable events documented.

## 2022-04-19 NOTE — Op Note (Signed)
04/19/2022  1:17 PM    Alexander Brewer  [id number]   Pre-Op Dx: Chronic bilateral maxillary sinusitis, chronic bilateral ethmoid sinusitis, chronic bilateral frontal sinusitis, left nasal polyp secondary to severe chronic inflammation.  Post-op Dx: Same  Proc: Bilateral endoscopic maxillary antrostomies with removal of contents, bilateral endoscopic total ethmoidectomy with frontal sinusotomies, left endoscopic nasal polypectomy, use of image guided system  Surg:  Alexander Brewer  Anes:  GOT  EBL: 100 mL  Comp: None  Findings: The maxillary sinuses were filled with creamy white-yellow pus on both sides.  The left medial wall of the maxillary sinus was pushing into the area with polypoid changes along its opening.  There was overflow pus coming out of the middle meatus on both sides.  There is chronic inflammation throughout the ethmoids on both sides and the frontal sinus openings.  Procedure: Patient was brought to the operating room placed in supine position.  He was given general anesthesia by oral endotracheal intubation once patient was fully asleep the nose was visualized.  The large stable septal perforation.  He has swelling of his left middle meatus polypoid changes here with pus running from the middle meatus.  The right side did not have polyps but had some thick drainage as well.  Cottonoid pledgets were placed on both sides with phenylephrine and Xylocaine for vasoconstriction and anesthesia.  The image guided system was brought in and the CT scan was downloaded to the disc.  The patient's face was then registered using the wand.  Once this was done there seem to be good alignment.  The straight suction and curved suctions were checked and they showed good alignment of the anatomy throughout.  The 0 degree scope was used in the left side first and the middle meatus was opened first.  The uncinate process was very swollen and stuck out into the nasal airway.  The microdebrider  was used for removing the uncinate process and removing some of the medial wall so that the maxillary sinus was wide open.  There was creamy pus filling the sinus that this was all suctioned clear.  He was irrigated a couple of times as well.  The mucous membrane was somewhat thickened all the way around but was not hiding any debris that I could see.  The 30 and 70 degree scopes were used for visualizing the depth of the sinus.  No unusual lesions were found.  I could feel the bone that had been elevated around the tooth root but I could not get down to it to unroofed this bone.  The 0 degree scope was then used for opening up the posterior ethmoid air cells and following the fovea ethmoidalis anteriorly.  He had a low hanging fovea in the midportion and then it went further superiorly as it went anteriorly to the anterior ethmoid air cells.  The 30 degree scope was used to visualize the anterior ethmoid air cells.  The ethmoid sinuses were filled with debris and inflammation around the bone chips.  The frontal sinus opening was not evident.  A Acclarent balloon sinuplasty instrument was readied and the frontal sinus was found with the tip.  Once the frontal sinus was found the balloon dilation was used to open this and open the frontal duct at the anterior ethmoid.  The frontal sinus Kerrisons were then used for removing some of the bone around the opening to the frontal sinus duct to make sure this was wide and clear.  The rest  of the bone shards in the anterior ethmoid were cleaned to make sure this would drain well.  The area was then covered with cottonoid pledgets with vasoconstriction while the other side was addressed.  The 0 degree scope was then used to visualize the right side.  The middle turbinate was infractured and a side biter was used to remove the uncinate process.  The medial wall of the maxillary sinus was very inflamed and there was creamy pus rolling out the natural ostium.  The medial wall  was opened and the maxillary antrum was widely opened as well.  This was done using the 30 degree scopes with the microdebrider.  The 70 degree scope was used to visualize the depth of the sinus and this was flushed couple times and suctioned out until there is no more pus remaining in the sinuses.  Once the maxillary sinus was cleaned out the posterior ethmoid cells were opened using the 0 degree scope and the image guided system to make sure all the air cells were opened.  The fovea was followed anteriorly and again it was very low hanging.  Some of the lateral bony walls of the ethmoid were extremely thin.  The 30 degree scope was used for opening up the middle and anterior ethmoid air cells.  This time the frontal sinus duct was found and widened using the Kerrison without having to use the balloon.  The frontal sinus was open widely and the remaining air cells the anterior ethmoid were opened up and cleared.  A cottonoid pledget was placed here temporarily soaked with phenylephrine.  The cotton pledgets were then removed starting on the left side and the sinuses were revisited.  Frontal sinus duct was opened and the ethmoids were clear.  The maxillary sinus was clear.  There is no significant bleeding at all.  Xerogel was then placed into the frontal sinus opening and the entire ethmoid.  There is some placed the opening to the maxillary sinus as well.  The right side also showed an open frontal sinus and the ethmoid was clear.  The maxillary sinus was clean as well and very minimal bleeding.  Xerogel was placed in the right side in a similar fashion.  The patient tolerated the procedure well.  He was awakened and taken to the recovery room in satisfactory condition.  There were no operative complications.  Dispo:   To PACU to be discharged home  Plan: To follow-up in the office in 5 to 6 days to evaluate his nose.  He will rest with his head elevated at home and will start saline flushes in his nose  tomorrow about 3 times a day.  He has some Vicodin for pain and will continue to use the clindamycin that he has at home 3 times a day.  He is given a small prednisone taper to use for the next 6 days as well.  He will call if he has any problems  Cammy Copa  04/19/2022 1:17 PM

## 2022-04-19 NOTE — Anesthesia Preprocedure Evaluation (Addendum)
Anesthesia Evaluation  Patient identified by MRN, date of birth, ID band Patient awake    Reviewed: Allergy & Precautions, H&P , NPO status , Patient's Chart, lab work & pertinent test results, reviewed documented beta blocker date and time   Airway Mallampati: III  TM Distance: <3 FB Neck ROM: Full    Dental no notable dental hx.    Pulmonary sleep apnea and Continuous Positive Airway Pressure Ventilation , former smoker Patient on home bipap and uses it faithfully    Pulmonary exam normal breath sounds clear to auscultation       Cardiovascular Exercise Tolerance: Good hypertension, Pt. on home beta blockers Normal cardiovascular exam Rhythm:Regular Rate:Normal  Took metoprolol this morning prior to arrival   Neuro/Psych negative neurological ROS  negative psych ROS   GI/Hepatic Neg liver ROS,GERD  Medicated,,  Endo/Other  negative endocrine ROS    Renal/GU negative Renal ROS  negative genitourinary   Musculoskeletal negative musculoskeletal ROS (+)    Abdominal   Peds negative pediatric ROS (+)  Hematology negative hematology ROS (+)   Anesthesia Other Findings   Reproductive/Obstetrics negative OB ROS                             Anesthesia Physical Anesthesia Plan  ASA: 3  Anesthesia Plan: General ETT   Post-op Pain Management:    Induction: Intravenous  PONV Risk Score and Plan:   Airway Management Planned: Oral ETT  Additional Equipment:   Intra-op Plan:   Post-operative Plan: Extubation in OR  Informed Consent: I have reviewed the patients History and Physical, chart, labs and discussed the procedure including the risks, benefits and alternatives for the proposed anesthesia with the patient or authorized representative who has indicated his/her understanding and acceptance.     Dental Advisory Given  Plan Discussed with: Anesthesiologist, CRNA and  Surgeon  Anesthesia Plan Comments: (Patient consented for risks of anesthesia including but not limited to:  - adverse reactions to medications - damage to eyes, teeth, lips or other oral mucosa - nerve damage due to positioning  - sore throat or hoarseness - Damage to heart, brain, nerves, lungs, other parts of body or loss of life  Patient voiced understanding.)        Anesthesia Quick Evaluation

## 2022-04-19 NOTE — Anesthesia Procedure Notes (Addendum)
Procedure Name: Intubation Date/Time: 04/19/2022 11:29 AM  Performed by: Andee Poles, CRNAPre-anesthesia Checklist: Patient identified, Emergency Drugs available, Suction available, Patient being monitored and Timeout performed Patient Re-evaluated:Patient Re-evaluated prior to induction Oxygen Delivery Method: Circle system utilized Preoxygenation: Pre-oxygenation with 100% oxygen Induction Type: IV induction Ventilation: Mask ventilation without difficulty Laryngoscope Size: Mac and 4 Grade View: Grade I Tube type: Oral Rae Tube size: 7.5 mm Number of attempts: 1 Airway Equipment and Method: LTA kit utilized Placement Confirmation: ETT inserted through vocal cords under direct vision, positive ETCO2 and breath sounds checked- equal and bilateral Tube secured with: Tape Dental Injury: Teeth and Oropharynx as per pre-operative assessment

## 2022-04-19 NOTE — Anesthesia Postprocedure Evaluation (Signed)
Anesthesia Post Note  Patient: Alexander Brewer.  Procedure(s) Performed: IMAGE GUIDED SINUS SURGERY (Bilateral: Nose) MAXILLARY ANTROSTOMY WITH REMOVAL CONTENTS (Bilateral: Nose) ENDOSCOPIC POLYPECTOMY NASAL (Left: Nose) ENDOSCOPIC TOTAL ETHMOIDECTOMY WITH FRONTAL SINUSOTOMY (Bilateral: Nose)  Patient location during evaluation: PACU Anesthesia Type: General Level of consciousness: awake and alert Pain management: pain level controlled Vital Signs Assessment: post-procedure vital signs reviewed and stable Respiratory status: spontaneous breathing, nonlabored ventilation, respiratory function stable and patient connected to nasal cannula oxygen Cardiovascular status: blood pressure returned to baseline and stable Postop Assessment: no apparent nausea or vomiting Anesthetic complications: no   No notable events documented.   Last Vitals:  Vitals:   04/19/22 1330 04/19/22 1345  BP: (!) 153/67 (!) 152/78  Pulse: (!) 56 69  Resp: 14 (!) 22  Temp:    SpO2: 92% 95%    Last Pain:  Vitals:   04/19/22 1345  TempSrc:   PainSc: 0-No pain                 Alejandra Hunt C Abrial Arrighi

## 2022-04-20 ENCOUNTER — Encounter: Payer: Self-pay | Admitting: Otolaryngology

## 2022-04-20 LAB — AEROBIC/ANAEROBIC CULTURE W GRAM STAIN (SURGICAL/DEEP WOUND)

## 2022-04-22 LAB — AEROBIC/ANAEROBIC CULTURE W GRAM STAIN (SURGICAL/DEEP WOUND): Gram Stain: NONE SEEN

## 2022-04-23 LAB — SURGICAL PATHOLOGY

## 2022-04-24 LAB — AEROBIC/ANAEROBIC CULTURE W GRAM STAIN (SURGICAL/DEEP WOUND): Culture: NO GROWTH

## 2022-06-20 ENCOUNTER — Other Ambulatory Visit: Payer: PRIVATE HEALTH INSURANCE

## 2022-06-22 ENCOUNTER — Inpatient Hospital Stay: Payer: Medicare Other | Attending: Oncology

## 2022-06-22 DIAGNOSIS — K760 Fatty (change of) liver, not elsewhere classified: Secondary | ICD-10-CM | POA: Diagnosis not present

## 2022-06-22 DIAGNOSIS — Z79899 Other long term (current) drug therapy: Secondary | ICD-10-CM | POA: Insufficient documentation

## 2022-06-22 DIAGNOSIS — Z87891 Personal history of nicotine dependence: Secondary | ICD-10-CM | POA: Diagnosis not present

## 2022-06-22 DIAGNOSIS — Z803 Family history of malignant neoplasm of breast: Secondary | ICD-10-CM | POA: Insufficient documentation

## 2022-06-22 LAB — CBC WITH DIFFERENTIAL (CANCER CENTER ONLY)
Abs Immature Granulocytes: 0.01 10*3/uL (ref 0.00–0.07)
Basophils Absolute: 0.1 10*3/uL (ref 0.0–0.1)
Basophils Relative: 2 %
Eosinophils Absolute: 0.2 10*3/uL (ref 0.0–0.5)
Eosinophils Relative: 4 %
HCT: 45.6 % (ref 39.0–52.0)
Hemoglobin: 15.6 g/dL (ref 13.0–17.0)
Immature Granulocytes: 0 %
Lymphocytes Relative: 35 %
Lymphs Abs: 1.9 10*3/uL (ref 0.7–4.0)
MCH: 30.6 pg (ref 26.0–34.0)
MCHC: 34.2 g/dL (ref 30.0–36.0)
MCV: 89.6 fL (ref 80.0–100.0)
Monocytes Absolute: 0.6 10*3/uL (ref 0.1–1.0)
Monocytes Relative: 12 %
Neutro Abs: 2.6 10*3/uL (ref 1.7–7.7)
Neutrophils Relative %: 47 %
Platelet Count: 194 10*3/uL (ref 150–400)
RBC: 5.09 MIL/uL (ref 4.22–5.81)
RDW: 11.5 % (ref 11.5–15.5)
WBC Count: 5.4 10*3/uL (ref 4.0–10.5)
nRBC: 0 % (ref 0.0–0.2)

## 2022-06-22 LAB — IRON AND TIBC
Iron: 158 ug/dL (ref 45–182)
Saturation Ratios: 56 % — ABNORMAL HIGH (ref 17.9–39.5)
TIBC: 281 ug/dL (ref 250–450)
UIBC: 123 ug/dL

## 2022-06-22 LAB — HEPATIC FUNCTION PANEL
ALT: 26 U/L (ref 0–44)
AST: 18 U/L (ref 15–41)
Albumin: 4.1 g/dL (ref 3.5–5.0)
Alkaline Phosphatase: 60 U/L (ref 38–126)
Bilirubin, Direct: 0.2 mg/dL (ref 0.0–0.2)
Indirect Bilirubin: 0.6 mg/dL (ref 0.3–0.9)
Total Bilirubin: 0.8 mg/dL (ref 0.3–1.2)
Total Protein: 7 g/dL (ref 6.5–8.1)

## 2022-06-22 LAB — FERRITIN: Ferritin: 114 ng/mL (ref 24–336)

## 2022-06-25 ENCOUNTER — Encounter: Payer: Self-pay | Admitting: Oncology

## 2022-06-25 ENCOUNTER — Inpatient Hospital Stay (HOSPITAL_BASED_OUTPATIENT_CLINIC_OR_DEPARTMENT_OTHER): Payer: Medicare Other | Admitting: Oncology

## 2022-06-25 VITALS — BP 135/84 | HR 55 | Temp 96.7°F | Resp 18 | Wt 223.4 lb

## 2022-06-25 DIAGNOSIS — K760 Fatty (change of) liver, not elsewhere classified: Secondary | ICD-10-CM

## 2022-06-25 NOTE — Assessment & Plan Note (Signed)
Homozygous hemochromatosis  Advised patient to have first-degree relative screening for hemochromatosis.  Avoid alcohol consumption.  Avoid uncooked seafood. US showed fatty liver disease.  Labs are reviewed and discussed with patient.  Lab Results  Component Value Date   HGB 15.6 06/22/2022   TIBC 281 06/22/2022   IRONPCTSAT 56 (H) 06/22/2022   FERRITIN 114 06/22/2022     Therapeutic phlebotomy is cost prohibitive to him.  Patient elects to donate blood to blood bank. We discuued  about the goal of ferritin less than 100, preferably less than 50. Recommend patient to donate 1 unit of PRBC monthly.  Avoid iron and vitamin C supplementation, food rich in iron and vitamin C.  First-degree relatives to screen for hemochromatosis genes.

## 2022-06-25 NOTE — Assessment & Plan Note (Signed)
Recommend lifestyle modification.  Increase exercise, healthy diet 

## 2022-06-26 ENCOUNTER — Encounter: Payer: Self-pay | Admitting: Oncology

## 2022-06-26 NOTE — Progress Notes (Signed)
Hematology/Oncology Consult note [phone] [phone]         Patient Care Team: Marina Goodell, MD as PCP - General (Family Medicine)  ASSESSMENT & PLAN:   Fatty liver Recommend lifestyle modification.  Increase exercise, healthy diet Recommend patient to follow up with Dr. Servando Snare.   Hemochromatosis, hereditary (HCC) Homozygous hemochromatosis  Advised patient to have first-degree relative screening for hemochromatosis.  Avoid alcohol consumption.  Avoid uncooked seafood. US showed fatty liver disease.  Labs are reviewed and discussed with patient.  Lab Results  Component Value Date   HGB 15.6 06/22/2022   TIBC 281 06/22/2022   IRONPCTSAT 56 (H) 06/22/2022   FERRITIN 114 06/22/2022     Therapeutic phlebotomy is cost prohibitive to him.  Patient elects to donate blood to blood bank. We discuued  about the goal of ferritin less than 100, preferably less than 50. Recommend patient to donate 1 unit of PRBC monthly.  Avoid iron and vitamin C supplementation, food rich in iron and vitamin C.  First-degree relatives to screen for hemochromatosis genes.   Orders Placed This Encounter  Procedures   CBC with Differential (Cancer Center Only)    Standing Status:   Future    Standing Expiration Date:   06/25/2023   Iron and TIBC    Standing Status:   Future    Standing Expiration Date:   06/25/2023   Ferritin    Standing Status:   Future    Standing Expiration Date:   06/25/2023   Follow-up in 3 months All questions were answered. The patient knows to call the clinic with any problems, questions or concerns.  Rickard Patience, MD, PhD [facility] Hematology Oncology 06/25/2022    CHIEF COMPLAINTS/REASON FOR VISIT:  Follow-up for iron overload, homozygous hemochromatosis  HISTORY OF PRESENTING ILLNESS:   Alexander Rosol. is a  67 y.o.  male with PMH listed below was seen in consultation at the request of  Feldpausch, Madaline Guthrie, MD  for evaluation of  iron  overload, homozygous hemochromatosis  Patient reports family history of hemochromatosis in her sister.  Recent blood work on 09/08/2021 showed ferritin level of 359, iron saturation 98%, TIBC 257.  Calcium 10.8.  Normal AST, ALT, alkaline phosphatase and bilirubin. Patient denies any current alcohol use.  Patient used to drink alcohol heavily in 20s.  Denies oral iron supplementation.  He takes vitamin C 500 mg twice daily for the past year and a half. Patient is prediabetic.  No abdominal pain, unintentional weight loss, bloating.  INTERVAL HISTORY Alexander Brewer. is a 67 y.o. male who has above history reviewed by me today presents for follow up visit for  iron overload, homozygous hemochromatosis. Patient had blood work done and present to discuss results He donates blood to local blood bank every 56 days.  No new complaints.   MEDICAL HISTORY:  Past Medical History:  Diagnosis Date   GERD (gastroesophageal reflux disease)    Hypertension    Obstructive sleep apnea treated with bilevel positive airway pressure (BiPAP)    Sleep apnea    CPAP    SURGICAL HISTORY: Past Surgical History:  Procedure Laterality Date   CATARACT EXTRACTION W/ INTRAOCULAR LENS  IMPLANT, BILATERAL     COLONOSCOPY     COLONOSCOPY WITH PROPOFOL N/A 02/24/2019   Procedure: COLONOSCOPY WITH BIOPSY;  Surgeon: Midge Minium, MD;  Location: [facility];  Service: Endoscopy;  Laterality: N/A;  Sleep apnea Priority 4   ETHMOIDECTOMY Bilateral  04/19/2022   Procedure: ENDOSCOPIC TOTAL ETHMOIDECTOMY WITH FRONTAL SINUSOTOMY;  Surgeon: Vernie Murders, MD;  Location: [facility];  Service: ENT;  Laterality: Bilateral;   HERNIA REPAIR     IMAGE GUIDED SINUS SURGERY Bilateral 04/19/2022   Procedure: IMAGE GUIDED SINUS SURGERY;  Surgeon: Vernie Murders, MD;  Location: [facility];  Service: ENT;  Laterality: Bilateral;   MAXILLARY ANTROSTOMY Bilateral 04/19/2022   Procedure: MAXILLARY ANTROSTOMY WITH  REMOVAL CONTENTS;  Surgeon: Vernie Murders, MD;  Location: [facility];  Service: ENT;  Laterality: Bilateral;   POLYPECTOMY N/A 02/24/2019   Procedure: POLYPECTOMY;  Surgeon: Midge Minium, MD;  Location: [facility];  Service: Endoscopy;  Laterality: N/A;   POLYPECTOMY Left 04/19/2022   Procedure: ENDOSCOPIC POLYPECTOMY NASAL;  Surgeon: Vernie Murders, MD;  Location: [facility];  Service: ENT;  Laterality: Left;   ROTATOR CUFF REPAIR  03/2011   UVULOPLASTY      SOCIAL HISTORY: Social History   Socioeconomic History   Marital status: Single    Spouse name: Not on file   Number of children: Not on file   Years of education: Not on file   Highest education level: Not on file  Occupational History   Not on file  Tobacco Use   Smoking status: Former    Packs/day: 0.50    Years: 3.00    Additional pack years: 0.00    Total pack years: 1.50    Types: Cigarettes    Quit date: 65    Years since quitting: 34.5   Smokeless tobacco: Never  Vaping Use   Vaping Use: Never used  Substance and Sexual Activity   Alcohol use: Not Currently   Drug use: Never   Sexual activity: Not on file  Other Topics Concern   Not on file  Social History Narrative   Not on file   Social Determinants of Health   Financial Resource Strain: Not on file  Food Insecurity: Not on file  Transportation Needs: Not on file  Physical Activity: Not on file  Stress: Not on file  Social Connections: Not on file  Intimate Partner Violence: Not on file    FAMILY HISTORY: Family History  Problem Relation Age of Onset   Breast cancer Mother    Heart failure Mother    Breast cancer Sister     ALLERGIES:  has No Known Allergies.  MEDICATIONS:  Current Outpatient Medications  Medication Sig Dispense Refill   acidophilus (RISAQUAD) CAPS capsule Take by mouth daily.     amLODipine (NORVASC) 5 MG tablet Take 5 mg by mouth daily.     atorvastatin (LIPITOR) 10 MG tablet Take 10 mg by  mouth daily.     atorvastatin (LIPITOR) 10 MG tablet Take 1 tablet by mouth daily.     dorzolamide-timolol (COSOPT) 22.3-6.8 MG/ML ophthalmic solution Place 1 drop into both eyes 2 (two) times daily.     GARLIC PO Take by mouth daily.     Ixekizumab (TALTZ [place]) Inject into the skin every 30 (thirty) days.     lisinopril (ZESTRIL) 40 MG tablet Take 40 mg by mouth daily.     melatonin 5 MG TABS Take 5 mg by mouth.     metoprolol tartrate (LOPRESSOR) 50 MG tablet Take 50 mg by mouth 2 (two) times daily.     naproxen sodium (ALEVE) 220 MG tablet Take 220 mg by mouth 2 (two) times daily as needed.     omeprazole (PRILOSEC) 20 MG capsule Take 20  mg by mouth daily.     TURMERIC CURCUMIN PO Take by mouth.     Vitamin D, Ergocalciferol, (DRISDOL) 1.25 MG (50000 UNIT) CAPS capsule Take 50,000 Units by mouth every 7 (seven) days.     zolpidem (AMBIEN CR) 6.25 MG CR tablet Take 6.25 mg by mouth at bedtime as needed for sleep.     No current facility-administered medications for this visit.    Review of Systems  Constitutional:  Negative for appetite change, chills, fatigue, fever and unexpected weight change.  HENT:   Negative for hearing loss and voice change.   Eyes:  Negative for eye problems and icterus.  Respiratory:  Negative for chest tightness, cough and shortness of breath.   Cardiovascular:  Negative for chest pain and leg swelling.  Gastrointestinal:  Negative for abdominal distention and abdominal pain.  Endocrine: Negative for hot flashes.  Genitourinary:  Negative for difficulty urinating, dysuria and frequency.   Musculoskeletal:  Negative for arthralgias.  Skin:  Negative for itching and rash.  Neurological:  Negative for light-headedness and numbness.  Hematological:  Negative for adenopathy. Does not bruise/bleed easily.  Psychiatric/Behavioral:  Negative for confusion.    PHYSICAL EXAMINATION: ECOG PERFORMANCE STATUS: 0 - Asymptomatic Vitals:   06/25/22 1322  BP: 135/84   Pulse: (!) 55  Resp: 18  Temp: (!) 96.7 F (35.9 C)   Filed Weights   06/25/22 1322  Weight: 223 lb 6.4 oz (101.3 kg)    Physical Exam Constitutional:      General: He is not in acute distress. HENT:     Head: Normocephalic and atraumatic.  Eyes:     General: No scleral icterus. Cardiovascular:     Rate and Rhythm: Normal rate and regular rhythm.     Heart sounds: Normal heart sounds.  Pulmonary:     Effort: Pulmonary effort is normal. No respiratory distress.     Breath sounds: No wheezing.  Abdominal:     General: Bowel sounds are normal. There is no distension.     Palpations: Abdomen is soft.  Musculoskeletal:        General: No deformity. Normal range of motion.     Cervical back: Normal range of motion and neck supple.  Skin:    General: Skin is warm and dry.     Findings: No erythema or rash.  Neurological:     Mental Status: He is alert and oriented to person, place, and time. Mental status is at baseline.     Cranial Nerves: No cranial nerve deficit.     Coordination: Coordination normal.  Psychiatric:        Mood and Affect: Mood normal.     LABORATORY DATA:  I have reviewed the data as listed    Latest Ref Rng & Units 06/22/2022    8:56 AM 03/16/2022    9:41 AM 01/18/2022    9:37 AM  CBC  WBC 4.0 - 10.5 K/uL 5.4  7.1  5.5   Hemoglobin 13.0 - 17.0 g/dL 09.8  11.9  14.7   Hematocrit 39.0 - 52.0 % 45.6  42.6  46.9   Platelets 150 - 400 K/uL 194  231  258       Latest Ref Rng & Units 06/22/2022    8:56 AM 12/15/2021   10:43 AM  CMP  Total Protein 6.5 - 8.1 g/dL 7.0  7.7   Total Bilirubin 0.3 - 1.2 mg/dL 0.8  0.6   Alkaline Phos 38 - 126 U/L 60  79   AST 15 - 41 U/L 18  23   ALT 0 - 44 U/L 26  37    09/14/2021, hepatitis panel negative.   RADIOGRAPHIC STUDIES: I have personally reviewed the radiological images as listed and agreed with the findings in the report. No results found.

## 2022-08-27 ENCOUNTER — Inpatient Hospital Stay: Payer: Medicare Other | Attending: Oncology

## 2022-08-27 LAB — CBC WITH DIFFERENTIAL (CANCER CENTER ONLY)
Abs Immature Granulocytes: 0.01 10*3/uL (ref 0.00–0.07)
Basophils Absolute: 0.1 10*3/uL (ref 0.0–0.1)
Basophils Relative: 1 %
Eosinophils Absolute: 0.2 10*3/uL (ref 0.0–0.5)
Eosinophils Relative: 4 %
HCT: 42.1 % (ref 39.0–52.0)
Hemoglobin: 14.1 g/dL (ref 13.0–17.0)
Immature Granulocytes: 0 %
Lymphocytes Relative: 27 %
Lymphs Abs: 1.5 10*3/uL (ref 0.7–4.0)
MCH: 30.7 pg (ref 26.0–34.0)
MCHC: 33.5 g/dL (ref 30.0–36.0)
MCV: 91.5 fL (ref 80.0–100.0)
Monocytes Absolute: 0.8 10*3/uL (ref 0.1–1.0)
Monocytes Relative: 14 %
Neutro Abs: 3 10*3/uL (ref 1.7–7.7)
Neutrophils Relative %: 54 %
Platelet Count: 193 10*3/uL (ref 150–400)
RBC: 4.6 MIL/uL (ref 4.22–5.81)
RDW: 11.7 % (ref 11.5–15.5)
WBC Count: 5.6 10*3/uL (ref 4.0–10.5)
nRBC: 0 % (ref 0.0–0.2)

## 2022-08-27 LAB — IRON AND TIBC
Iron: 174 ug/dL (ref 45–182)
Saturation Ratios: 63 % — ABNORMAL HIGH (ref 17.9–39.5)
TIBC: 276 ug/dL (ref 250–450)
UIBC: 102 ug/dL

## 2022-08-27 LAB — FERRITIN: Ferritin: 51 ng/mL (ref 24–336)

## 2022-08-31 ENCOUNTER — Inpatient Hospital Stay (HOSPITAL_BASED_OUTPATIENT_CLINIC_OR_DEPARTMENT_OTHER): Payer: Medicare Other | Admitting: Oncology

## 2022-08-31 ENCOUNTER — Encounter: Payer: Self-pay | Admitting: Oncology

## 2022-08-31 DIAGNOSIS — K760 Fatty (change of) liver, not elsewhere classified: Secondary | ICD-10-CM | POA: Diagnosis not present

## 2022-08-31 NOTE — Assessment & Plan Note (Addendum)
Homozygous hemochromatosis  Advised patient to have first-degree relative screening for hemochromatosis.  Avoid alcohol consumption.  Avoid uncooked seafood. US showed fatty liver disease.  Labs are reviewed and discussed with patient.  Lab Results  Component Value Date   HGB 14.1 08/27/2022   TIBC 276 08/27/2022   IRONPCTSAT 63 (H) 08/27/2022   FERRITIN 51 08/27/2022     Therapeutic phlebotomy is cost prohibitive to him.  Patient elects to donate blood to blood bank. We discuued  about the goal of ferritin less than 100, preferably less than 50. continue donate 1 unit of PRBC every 4-6 weeks  Avoid iron and vitamin C supplementation, food rich in iron and vitamin C.   First-degree relatives to screen for hemochromatosis genes.

## 2022-08-31 NOTE — Progress Notes (Signed)
Hematology/Oncology Consult note [phone] [phone]         Patient Care Team: Marina Goodell, MD as PCP - General (Family Medicine) Rickard Patience, MD as Consulting Physician (Oncology)  ASSESSMENT & PLAN:   Hemochromatosis, hereditary [facility]) Homozygous hemochromatosis  Advised patient to have first-degree relative screening for hemochromatosis.  Avoid alcohol consumption.  Avoid uncooked seafood. US showed fatty liver disease.  Labs are reviewed and discussed with patient.  Lab Results  Component Value Date   HGB 14.1 08/27/2022   TIBC 276 08/27/2022   IRONPCTSAT 63 (H) 08/27/2022   FERRITIN 51 08/27/2022     Therapeutic phlebotomy is cost prohibitive to him.  Patient elects to donate blood to blood bank. We discuued  about the goal of ferritin less than 100, preferably less than 50. continue donate 1 unit of PRBC every 4-6 weeks  Avoid iron and vitamin C supplementation, food rich in iron and vitamin C.   First-degree relatives to screen for hemochromatosis genes.  Fatty liver Recommend lifestyle modification.  Increase exercise, healthy diet Recommend patient to follow up with Dr. Servando Snare.    Orders Placed This Encounter  Procedures   CBC with Differential (Cancer Center Only)    Standing Status:   Future    Standing Expiration Date:   08/31/2023   Hepatic function panel    Standing Status:   Future    Standing Expiration Date:   08/31/2023   Iron and TIBC    Standing Status:   Future    Standing Expiration Date:   08/31/2023   Ferritin    Standing Status:   Future    Standing Expiration Date:   08/31/2023   Follow-up in 3 months All questions were answered. The patient knows to call the clinic with any problems, questions or concerns.  Rickard Patience, MD, PhD [facility] Hematology Oncology 08/31/2022    CHIEF COMPLAINTS/REASON FOR VISIT:  Follow-up for iron overload, homozygous hemochromatosis  HISTORY OF PRESENTING ILLNESS:   Alexander Brewer. is a  67 y.o.  male with PMH listed below was seen in consultation at the request of  Feldpausch, Madaline Guthrie, MD  for evaluation of  iron overload, homozygous hemochromatosis  Patient reports family history of hemochromatosis in her sister.  Recent blood work on 09/08/2021 showed ferritin level of 359, iron saturation 98%, TIBC 257.  Calcium 10.8.  Normal AST, ALT, alkaline phosphatase and bilirubin. Patient denies any current alcohol use.  Patient used to drink alcohol heavily in 20s.  Denies oral iron supplementation.  He takes vitamin C 500 mg twice daily for the past year and a half. Patient is prediabetic.  No abdominal pain, unintentional weight loss, bloating.  INTERVAL HISTORY Alexander Brewer. is a 67 y.o. male who has above history reviewed by me today presents for follow up visit for  iron overload, homozygous hemochromatosis. Patient had blood work done and present to discuss results He donates blood to local blood bank monthly  No new complaints.   MEDICAL HISTORY:  Past Medical History:  Diagnosis Date   GERD (gastroesophageal reflux disease)    Hypertension    Obstructive sleep apnea treated with bilevel positive airway pressure (BiPAP)    Sleep apnea    CPAP    SURGICAL HISTORY: Past Surgical History:  Procedure Laterality Date   CATARACT EXTRACTION W/ INTRAOCULAR LENS  IMPLANT, BILATERAL     COLONOSCOPY     COLONOSCOPY WITH PROPOFOL N/A 02/24/2019   Procedure:  COLONOSCOPY WITH BIOPSY;  Surgeon: Midge Minium, MD;  Location: [facility];  Service: Endoscopy;  Laterality: N/A;  Sleep apnea Priority 4   ETHMOIDECTOMY Bilateral 04/19/2022   Procedure: ENDOSCOPIC TOTAL ETHMOIDECTOMY WITH FRONTAL SINUSOTOMY;  Surgeon: Vernie Murders, MD;  Location: [facility];  Service: ENT;  Laterality: Bilateral;   HERNIA REPAIR     IMAGE GUIDED SINUS SURGERY Bilateral 04/19/2022   Procedure: IMAGE GUIDED SINUS SURGERY;  Surgeon: Vernie Murders, MD;  Location: [facility];  Service: ENT;  Laterality: Bilateral;   MAXILLARY ANTROSTOMY Bilateral 04/19/2022   Procedure: MAXILLARY ANTROSTOMY WITH REMOVAL CONTENTS;  Surgeon: Vernie Murders, MD;  Location: [facility];  Service: ENT;  Laterality: Bilateral;   POLYPECTOMY N/A 02/24/2019   Procedure: POLYPECTOMY;  Surgeon: Midge Minium, MD;  Location: [facility];  Service: Endoscopy;  Laterality: N/A;   POLYPECTOMY Left 04/19/2022   Procedure: ENDOSCOPIC POLYPECTOMY NASAL;  Surgeon: Vernie Murders, MD;  Location: [facility];  Service: ENT;  Laterality: Left;   ROTATOR CUFF REPAIR  03/2011   UVULOPLASTY      SOCIAL HISTORY: Social History   Socioeconomic History   Marital status: Single    Spouse name: Not on file   Number of children: Not on file   Years of education: Not on file   Highest education level: Not on file  Occupational History   Not on file  Tobacco Use   Smoking status: Former    Current packs/day: 0.00    Average packs/day: 0.5 packs/day for 3.0 years (1.5 ttl pk-yrs)    Types: Cigarettes    Start date: 32    Quit date: 51    Years since quitting: 34.6   Smokeless tobacco: Never  Vaping Use   Vaping status: Never Used  Substance and Sexual Activity   Alcohol use: Not Currently   Drug use: Never   Sexual activity: Not on file  Other Topics Concern   Not on file  Social History Narrative   Not on file   Social Determinants of Health   Financial Resource Strain: Not on file  Food Insecurity: Not on file  Transportation Needs: Not on file  Physical Activity: Not on file  Stress: Not on file  Social Connections: Not on file  Intimate Partner Violence: Not on file    FAMILY HISTORY: Family History  Problem Relation Age of Onset   Breast cancer Mother    Heart failure Mother    Breast cancer Sister     ALLERGIES:  has No Known Allergies.  MEDICATIONS:  Current Outpatient Medications  Medication Sig Dispense Refill   amLODipine  (NORVASC) 5 MG tablet Take 5 mg by mouth daily.     atorvastatin (LIPITOR) 10 MG tablet Take 10 mg by mouth daily.     dorzolamide-timolol (COSOPT) 22.3-6.8 MG/ML ophthalmic solution Place 1 drop into both eyes 2 (two) times daily.     GARLIC PO Take by mouth daily.     Ixekizumab (TALTZ [place]) Inject into the skin every 30 (thirty) days.     lisinopril (ZESTRIL) 40 MG tablet Take 40 mg by mouth daily.     melatonin 5 MG TABS Take 5 mg by mouth.     metoprolol tartrate (LOPRESSOR) 50 MG tablet Take 50 mg by mouth 2 (two) times daily.     naproxen sodium (ALEVE) 220 MG tablet Take 220 mg by mouth 2 (two) times daily as needed.     omeprazole (PRILOSEC) 20 MG capsule  Take 20 mg by mouth daily.     TURMERIC CURCUMIN PO Take by mouth.     Vitamin D, Ergocalciferol, (DRISDOL) 1.25 MG (50000 UNIT) CAPS capsule Take 50,000 Units by mouth every 7 (seven) days.     zolpidem (AMBIEN CR) 6.25 MG CR tablet Take 6.25 mg by mouth at bedtime as needed for sleep.     No current facility-administered medications for this visit.    Review of Systems  Constitutional:  Negative for appetite change, chills, fatigue, fever and unexpected weight change.  HENT:   Negative for hearing loss and voice change.   Eyes:  Negative for eye problems and icterus.  Respiratory:  Negative for chest tightness, cough and shortness of breath.   Cardiovascular:  Negative for chest pain and leg swelling.  Gastrointestinal:  Negative for abdominal distention and abdominal pain.  Endocrine: Negative for hot flashes.  Genitourinary:  Negative for difficulty urinating, dysuria and frequency.   Musculoskeletal:  Negative for arthralgias.  Skin:  Negative for itching and rash.  Neurological:  Negative for light-headedness and numbness.  Hematological:  Negative for adenopathy. Does not bruise/bleed easily.  Psychiatric/Behavioral:  Negative for confusion.    PHYSICAL EXAMINATION: ECOG PERFORMANCE STATUS: 0 - Asymptomatic Vitals:    08/31/22 1039  BP: 139/80  Pulse: (!) 52  Resp: 18  Temp: 97.8 F (36.6 C)   Filed Weights   08/31/22 1039  Weight: 224 lb 9.6 oz (101.9 kg)    Physical Exam Constitutional:      General: He is not in acute distress. HENT:     Head: Normocephalic and atraumatic.  Eyes:     General: No scleral icterus. Cardiovascular:     Rate and Rhythm: Normal rate and regular rhythm.     Heart sounds: Normal heart sounds.  Pulmonary:     Effort: Pulmonary effort is normal. No respiratory distress.     Breath sounds: No wheezing.  Abdominal:     General: Bowel sounds are normal. There is no distension.     Palpations: Abdomen is soft.  Musculoskeletal:        General: No deformity. Normal range of motion.     Cervical back: Normal range of motion and neck supple.  Skin:    General: Skin is warm and dry.     Findings: No erythema or rash.  Neurological:     Mental Status: He is alert and oriented to person, place, and time. Mental status is at baseline.     Cranial Nerves: No cranial nerve deficit.     Coordination: Coordination normal.  Psychiatric:        Mood and Affect: Mood normal.     LABORATORY DATA:  I have reviewed the data as listed    Latest Ref Rng & Units 08/27/2022    9:21 AM 06/22/2022    8:56 AM 03/16/2022    9:41 AM  CBC  WBC 4.0 - 10.5 K/uL 5.6  5.4  7.1   Hemoglobin 13.0 - 17.0 g/dL 81.1  91.4  78.2   Hematocrit 39.0 - 52.0 % 42.1  45.6  42.6   Platelets 150 - 400 K/uL 193  194  231       Latest Ref Rng & Units 06/22/2022    8:56 AM 12/15/2021   10:43 AM  CMP  Total Protein 6.5 - 8.1 g/dL 7.0  7.7   Total Bilirubin 0.3 - 1.2 mg/dL 0.8  0.6   Alkaline Phos 38 - 126 U/L 60  79   AST 15 - 41 U/L 18  23   ALT 0 - 44 U/L 26  37    09/14/2021, hepatitis panel negative.   RADIOGRAPHIC STUDIES: I have personally reviewed the radiological images as listed and agreed with the findings in the report. No results found.

## 2022-08-31 NOTE — Assessment & Plan Note (Signed)
Recommend lifestyle modification.  Increase exercise, healthy diet Recommend patient to follow up with Dr. Servando Snare.

## 2022-12-31 ENCOUNTER — Inpatient Hospital Stay: Payer: Medicare Other | Attending: Oncology

## 2022-12-31 LAB — CBC WITH DIFFERENTIAL (CANCER CENTER ONLY)
Abs Immature Granulocytes: 0.02 10*3/uL (ref 0.00–0.07)
Basophils Absolute: 0.1 10*3/uL (ref 0.0–0.1)
Basophils Relative: 1 %
Eosinophils Absolute: 0.2 10*3/uL (ref 0.0–0.5)
Eosinophils Relative: 4 %
HCT: 39.2 % (ref 39.0–52.0)
Hemoglobin: 12.9 g/dL — ABNORMAL LOW (ref 13.0–17.0)
Immature Granulocytes: 0 %
Lymphocytes Relative: 35 %
Lymphs Abs: 1.8 10*3/uL (ref 0.7–4.0)
MCH: 28.9 pg (ref 26.0–34.0)
MCHC: 32.9 g/dL (ref 30.0–36.0)
MCV: 87.7 fL (ref 80.0–100.0)
Monocytes Absolute: 0.8 10*3/uL (ref 0.1–1.0)
Monocytes Relative: 16 %
Neutro Abs: 2.3 10*3/uL (ref 1.7–7.7)
Neutrophils Relative %: 44 %
Platelet Count: 220 10*3/uL (ref 150–400)
RBC: 4.47 MIL/uL (ref 4.22–5.81)
RDW: 11.9 % (ref 11.5–15.5)
WBC Count: 5.2 10*3/uL (ref 4.0–10.5)
nRBC: 0 % (ref 0.0–0.2)

## 2022-12-31 LAB — HEPATIC FUNCTION PANEL
ALT: 22 U/L (ref 0–44)
AST: 21 U/L (ref 15–41)
Albumin: 4 g/dL (ref 3.5–5.0)
Alkaline Phosphatase: 45 U/L (ref 38–126)
Bilirubin, Direct: <0.1 mg/dL (ref 0.0–0.2)
Total Bilirubin: 0.7 mg/dL (ref 0.0–1.2)
Total Protein: 7.2 g/dL (ref 6.5–8.1)

## 2022-12-31 LAB — FERRITIN: Ferritin: 6 ng/mL — ABNORMAL LOW (ref 24–336)

## 2022-12-31 LAB — IRON AND TIBC
Iron: 58 ug/dL (ref 45–182)
Saturation Ratios: 18 % (ref 17.9–39.5)
TIBC: 329 ug/dL (ref 250–450)
UIBC: 271 ug/dL

## 2023-01-07 ENCOUNTER — Encounter: Payer: Self-pay | Admitting: Oncology

## 2023-01-07 ENCOUNTER — Inpatient Hospital Stay: Payer: Medicare Other | Attending: Oncology | Admitting: Oncology

## 2023-01-07 DIAGNOSIS — K760 Fatty (change of) liver, not elsewhere classified: Secondary | ICD-10-CM

## 2023-01-07 NOTE — Assessment & Plan Note (Addendum)
 Homozygous hemochromatosis  Advised patient to have first-degree relative screening for hemochromatosis.  Avoid alcohol consumption.  Avoid uncooked seafood.  US  showed fatty liver disease.  Labs are reviewed and discussed with patient.  Lab Results  Component Value Date   HGB 12.9 (L) 12/31/2022   TIBC 329 12/31/2022   IRONPCTSAT 18 12/31/2022   FERRITIN 6 (L) 12/31/2022    Patient elects to donate blood to blood bank. 1 unit of PRBC every 4-6 weeks  Hold off donation for now.  Avoid iron and vitamin C supplementation, food rich in iron and vitamin C.

## 2023-01-07 NOTE — Assessment & Plan Note (Signed)
Recommend lifestyle modification.  Increase exercise, healthy diet 

## 2023-01-07 NOTE — Progress Notes (Signed)
 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  Avoid alcohol consumption.  Avoid uncooked seafood.  US  showed fatty liver disease.  Labs are reviewed and discussed with patient.  Lab Results  Component Value Date   HGB 12.9 (L) 12/31/2022   TIBC 329 12/31/2022   IRONPCTSAT 18 12/31/2022   FERRITIN 6 (L) 12/31/2022    Patient elects to donate blood to blood bank. 1 unit of PRBC every 4-6 weeks  Hold off donation for now.  Avoid iron and vitamin C supplementation, food rich in iron and vitamin C.     Fatty liver Recommend lifestyle modification.  Increase exercise, healthy diet    Orders Placed This Encounter  Procedures   CBC with Differential (Cancer Center Only)    Standing Status:   Future    Expected Date:   05/07/2023    Expiration Date:   01/07/2024   Iron and TIBC    Standing Status:   Future    Expected Date:   05/07/2023    Expiration Date:   01/07/2024   Ferritin    Standing Status:   Future    Expected Date:   05/07/2023    Expiration Date:   01/07/2024   Hepatic function panel    Standing Status:   Future    Expected Date:   05/07/2023    Expiration Date:   01/07/2024   Follow-up in 4 months All questions were answered. The patient knows to call the clinic with any problems, questions or concerns.  Call Babara, MD, PhD [facility] Hematology Oncology 01/07/2023    CHIEF COMPLAINTS/REASON FOR VISIT:  Follow-up for iron overload, homozygous hemochromatosis  HISTORY OF PRESENTING ILLNESS:   Alexander Pettey. is a  68 y.o.  male with PMH listed below was seen in consultation at the request of  Feldpausch, Cheryl BRAVO, MD  for evaluation of   iron overload, homozygous hemochromatosis  Patient reports family history of hemochromatosis in her sister.  Recent blood work on 09/08/2021 showed ferritin level of 359, iron saturation 98%, TIBC 257.  Calcium 10.8.  Normal AST, ALT, alkaline phosphatase and bilirubin. Patient denies any current alcohol use.  Patient used to drink alcohol heavily in 20s.  Denies oral iron supplementation.  He takes vitamin C 500 mg twice daily for the past year and a half. Patient is prediabetic.  No abdominal pain, unintentional weight loss, bloating.  INTERVAL HISTORY Alexander Baughman. is a 68 y.o. male who has above history reviewed by me today presents for follow up visit for  iron overload, homozygous hemochromatosis. Patient had blood work done and present to discuss results He donates blood to local blood bank 3 times since last visit.  No new complaints.   MEDICAL HISTORY:  Past Medical History:  Diagnosis Date   GERD (gastroesophageal reflux disease)    Hypertension    Obstructive sleep apnea treated with bilevel positive airway pressure (BiPAP)    Sleep apnea    CPAP    SURGICAL HISTORY: Past Surgical History:  Procedure Laterality Date   CATARACT EXTRACTION W/ INTRAOCULAR LENS  IMPLANT, BILATERAL     COLONOSCOPY     COLONOSCOPY WITH PROPOFOL  N/A 02/24/2019  Procedure: COLONOSCOPY WITH BIOPSY;  Surgeon: Jinny Carmine, MD;  Location: [facility];  Service: Endoscopy;  Laterality: N/A;  Sleep apnea Priority 4   ETHMOIDECTOMY Bilateral 04/19/2022   Procedure: ENDOSCOPIC TOTAL ETHMOIDECTOMY WITH FRONTAL SINUSOTOMY;  Surgeon: Edda Mt, MD;  Location: [facility];  Service: ENT;  Laterality: Bilateral;   HERNIA REPAIR     IMAGE GUIDED SINUS SURGERY Bilateral 04/19/2022   Procedure: IMAGE GUIDED SINUS SURGERY;  Surgeon: Edda Mt, MD;  Location: [facility];  Service: ENT;  Laterality: Bilateral;   MAXILLARY ANTROSTOMY Bilateral 04/19/2022   Procedure: MAXILLARY  ANTROSTOMY WITH REMOVAL CONTENTS;  Surgeon: Edda Mt, MD;  Location: [facility];  Service: ENT;  Laterality: Bilateral;   POLYPECTOMY N/A 02/24/2019   Procedure: POLYPECTOMY;  Surgeon: Jinny Carmine, MD;  Location: [facility];  Service: Endoscopy;  Laterality: N/A;   POLYPECTOMY Left 04/19/2022   Procedure: ENDOSCOPIC POLYPECTOMY NASAL;  Surgeon: Edda Mt, MD;  Location: [facility];  Service: ENT;  Laterality: Left;   ROTATOR CUFF REPAIR  03/2011   UVULOPLASTY      SOCIAL HISTORY: Social History   Socioeconomic History   Marital status: Single    Spouse name: Not on file   Number of children: Not on file   Years of education: Not on file   Highest education level: Not on file  Occupational History   Not on file  Tobacco Use   Smoking status: Former    Current packs/day: 0.00    Average packs/day: 0.5 packs/day for 3.0 years (1.5 ttl pk-yrs)    Types: Cigarettes    Start date: 46    Quit date: 87    Years since quitting: 35.0   Smokeless tobacco: Never  Vaping Use   Vaping status: Never Used  Substance and Sexual Activity   Alcohol use: Not Currently   Drug use: Never   Sexual activity: Not on file  Other Topics Concern   Not on file  Social History Narrative   Not on file   Social Drivers of Health   Financial Resource Strain: Not on file  Food Insecurity: Not on file  Transportation Needs: Not on file  Physical Activity: Not on file  Stress: Not on file  Social Connections: Not on file  Intimate Partner Violence: Not on file    FAMILY HISTORY: Family History  Problem Relation Age of Onset   Breast cancer Mother    Heart failure Mother    Breast cancer Sister     ALLERGIES:  has no known allergies.  MEDICATIONS:  Current Outpatient Medications  Medication Sig Dispense Refill   amLODipine (NORVASC) 5 MG tablet Take 5 mg by mouth daily.     atorvastatin (LIPITOR) 10 MG tablet Take 10 mg by mouth daily.      dorzolamide-timolol (COSOPT) 22.3-6.8 MG/ML ophthalmic solution Place 1 drop into both eyes 2 (two) times daily.     GARLIC PO Take by mouth daily.     Ixekizumab (TALTZ [place]) Inject into the skin every 30 (thirty) days.     lisinopril (ZESTRIL) 40 MG tablet Take 40 mg by mouth daily.     melatonin 5 MG TABS Take 5 mg by mouth.     metoprolol tartrate (LOPRESSOR) 50 MG tablet Take 50 mg by mouth 2 (two) times daily.     naproxen sodium (ALEVE) 220 MG tablet Take 220 mg by mouth 2 (two) times daily as needed.     omeprazole (PRILOSEC) 20 MG  capsule Take 20 mg by mouth daily.     TURMERIC CURCUMIN PO Take by mouth.     zolpidem (AMBIEN CR) 6.25 MG CR tablet Take 6.25 mg by mouth at bedtime as needed for sleep.     No current facility-administered medications for this visit.    Review of Systems  Constitutional:  Negative for appetite change, chills, fatigue, fever and unexpected weight change.  HENT:   Negative for hearing loss and voice change.   Eyes:  Negative for eye problems and icterus.  Respiratory:  Negative for chest tightness, cough and shortness of breath.   Cardiovascular:  Negative for chest pain and leg swelling.  Gastrointestinal:  Negative for abdominal distention and abdominal pain.  Endocrine: Negative for hot flashes.  Genitourinary:  Negative for difficulty urinating, dysuria and frequency.   Musculoskeletal:  Negative for arthralgias.  Skin:  Negative for itching and rash.  Neurological:  Negative for light-headedness and numbness.  Hematological:  Negative for adenopathy. Does not bruise/bleed easily.  Psychiatric/Behavioral:  Negative for confusion.    PHYSICAL EXAMINATION: ECOG PERFORMANCE STATUS: 0 - Asymptomatic Vitals:   01/07/23 1006  BP: (!) 145/87  Pulse: (!) 54  Resp: 18  Temp: (!) 96 F (35.6 C)  SpO2: 98%   Filed Weights   01/07/23 1006  Weight: 226 lb 6.4 oz (102.7 kg)    Physical Exam Constitutional:      General: He is not in acute  distress. HENT:     Head: Normocephalic and atraumatic.  Eyes:     General: No scleral icterus. Cardiovascular:     Rate and Rhythm: Normal rate and regular rhythm.     Heart sounds: Normal heart sounds.  Pulmonary:     Effort: Pulmonary effort is normal. No respiratory distress.     Breath sounds: No wheezing.  Abdominal:     General: Bowel sounds are normal. There is no distension.     Palpations: Abdomen is soft.  Musculoskeletal:        General: No deformity. Normal range of motion.     Cervical back: Normal range of motion and neck supple.  Skin:    General: Skin is warm and dry.     Findings: No erythema or rash.  Neurological:     Mental Status: He is alert and oriented to person, place, and time. Mental status is at baseline.     Cranial Nerves: No cranial nerve deficit.     Coordination: Coordination normal.  Psychiatric:        Mood and Affect: Mood normal.     LABORATORY DATA:  I have reviewed the data as listed    Latest Ref Rng & Units 12/31/2022    9:58 AM 08/27/2022    9:21 AM 06/22/2022    8:56 AM  CBC  WBC 4.0 - 10.5 K/uL 5.2  5.6  5.4   Hemoglobin 13.0 - 17.0 g/dL 87.0  85.8  84.3   Hematocrit 39.0 - 52.0 % 39.2  42.1  45.6   Platelets 150 - 400 K/uL 220  193  194       Latest Ref Rng & Units 12/31/2022    9:58 AM 06/22/2022    8:56 AM 12/15/2021   10:43 AM  CMP  Total Protein 6.5 - 8.1 g/dL 7.2  7.0  7.7   Total Bilirubin 0.0 - 1.2 mg/dL 0.7  0.8  0.6   Alkaline Phos 38 - 126 U/L 45  60  79   AST  15 - 41 U/L 21  18  23    ALT 0 - 44 U/L 22  26  37    09/14/2021, hepatitis panel negative.   RADIOGRAPHIC STUDIES: I have personally reviewed the radiological images as listed and agreed with the findings in the report. No results found.

## 2023-05-06 ENCOUNTER — Inpatient Hospital Stay: Payer: PRIVATE HEALTH INSURANCE | Attending: Oncology

## 2023-05-06 DIAGNOSIS — Z79899 Other long term (current) drug therapy: Secondary | ICD-10-CM | POA: Diagnosis not present

## 2023-05-06 LAB — CBC WITH DIFFERENTIAL (CANCER CENTER ONLY)
Abs Immature Granulocytes: 0.01 10*3/uL (ref 0.00–0.07)
Basophils Absolute: 0.1 10*3/uL (ref 0.0–0.1)
Basophils Relative: 1 %
Eosinophils Absolute: 0.2 10*3/uL (ref 0.0–0.5)
Eosinophils Relative: 3 %
HCT: 46.7 % (ref 39.0–52.0)
Hemoglobin: 15 g/dL (ref 13.0–17.0)
Immature Granulocytes: 0 %
Lymphocytes Relative: 28 %
Lymphs Abs: 1.6 10*3/uL (ref 0.7–4.0)
MCH: 26.9 pg (ref 26.0–34.0)
MCHC: 32.1 g/dL (ref 30.0–36.0)
MCV: 83.8 fL (ref 80.0–100.0)
Monocytes Absolute: 0.7 10*3/uL (ref 0.1–1.0)
Monocytes Relative: 12 %
Neutro Abs: 3.3 10*3/uL (ref 1.7–7.7)
Neutrophils Relative %: 56 %
Platelet Count: 233 10*3/uL (ref 150–400)
RBC: 5.57 MIL/uL (ref 4.22–5.81)
RDW: 14.1 % (ref 11.5–15.5)
WBC Count: 5.9 10*3/uL (ref 4.0–10.5)
nRBC: 0 % (ref 0.0–0.2)

## 2023-05-06 LAB — HEPATIC FUNCTION PANEL
ALT: 21 U/L (ref 0–44)
AST: 15 U/L (ref 15–41)
Albumin: 4.3 g/dL (ref 3.5–5.0)
Alkaline Phosphatase: 57 U/L (ref 38–126)
Bilirubin, Direct: 0.2 mg/dL (ref 0.0–0.2)
Indirect Bilirubin: 0.5 mg/dL (ref 0.3–0.9)
Total Bilirubin: 0.7 mg/dL (ref 0.0–1.2)
Total Protein: 7.2 g/dL (ref 6.5–8.1)

## 2023-05-06 LAB — IRON AND TIBC
Iron: 83 ug/dL (ref 45–182)
Saturation Ratios: 25 % (ref 17.9–39.5)
TIBC: 329 ug/dL (ref 250–450)
UIBC: 246 ug/dL

## 2023-05-06 LAB — FERRITIN: Ferritin: 10 ng/mL — ABNORMAL LOW (ref 24–336)

## 2023-05-09 ENCOUNTER — Encounter: Payer: Self-pay | Admitting: Oncology

## 2023-05-09 ENCOUNTER — Inpatient Hospital Stay (HOSPITAL_BASED_OUTPATIENT_CLINIC_OR_DEPARTMENT_OTHER): Payer: PRIVATE HEALTH INSURANCE | Admitting: Oncology

## 2023-05-09 DIAGNOSIS — K760 Fatty (change of) liver, not elsewhere classified: Secondary | ICD-10-CM

## 2023-05-09 NOTE — Assessment & Plan Note (Signed)
Recommend lifestyle modification.  Increase exercise, healthy diet 

## 2023-05-09 NOTE — Progress Notes (Signed)
 Hematology/Oncology Progress note [phone] [phone]            Patient Care Team: Alexander Rothman, MD as PCP - General (Family Medicine) Alexander Forbes, MD as Consulting Physician (Oncology)  ASSESSMENT & PLAN:   Hemochromatosis, hereditary [facility]) Homozygous hemochromatosis  Advised patient to have first-degree relative screening for hemochromatosis.  Avoid alcohol consumption.  Avoid uncooked seafood.  US  showed fatty liver disease.  Labs are reviewed and discussed with patient.  Lab Results  Component Value Date   HGB 15.0 05/06/2023   TIBC 329 05/06/2023   IRONPCTSAT 25 05/06/2023   FERRITIN 10 (L) 05/06/2023     Hold off donation for now.  Avoid iron and vitamin C supplementation, food rich in iron and vitamin C.     Fatty liver Recommend lifestyle modification.  Increase exercise, healthy diet    Orders Placed This Encounter  Procedures   CBC with Differential (Cancer Center Only)    Standing Status:   Future    Expected Date:   09/09/2023    Expiration Date:   05/08/2024   Iron and TIBC    Standing Status:   Future    Expected Date:   09/09/2023    Expiration Date:   05/08/2024   Ferritin    Standing Status:   Future    Expected Date:   09/09/2023    Expiration Date:   05/08/2024   Hepatic function panel    Standing Status:   Future    Expected Date:   09/09/2023    Expiration Date:   05/08/2024   Follow-up in 4 months All questions were answered. The patient knows to call the clinic with any problems, questions or concerns.  Alexander Forbes, MD, PhD [facility] Hematology Oncology 05/09/2023    CHIEF COMPLAINTS/REASON FOR VISIT:  Follow-up for iron overload, homozygous hemochromatosis  HISTORY OF PRESENTING ILLNESS:   Alexander Paar. is a  68 y.o.  male with PMH listed below was seen in consultation at the request of  Feldpausch, Genetta Kenning, MD  for evaluation of  iron overload, homozygous hemochromatosis  Patient reports family history of  hemochromatosis in her sister.  Recent blood work on 09/08/2021 showed ferritin level of 359, iron saturation 98%, TIBC 257.  Calcium 10.8.  Normal AST, ALT, alkaline phosphatase and bilirubin. Patient denies any current alcohol use.  Patient used to drink alcohol heavily in 20s.  Denies oral iron supplementation.  He takes vitamin C 500 mg twice daily for the past year and a half. Patient is prediabetic.  No abdominal pain, unintentional weight loss, bloating.  INTERVAL HISTORY Alexander Parmley. is a 68 y.o. male who has above history reviewed by me today presents for follow up visit for  iron overload, homozygous hemochromatosis. Patient had blood work done and present to discuss results No new complaints.   MEDICAL HISTORY:  Past Medical History:  Diagnosis Date   GERD (gastroesophageal reflux disease)    Hypertension    Obstructive sleep apnea treated with bilevel positive airway pressure (BiPAP)    Sleep apnea    CPAP    SURGICAL HISTORY: Past Surgical History:  Procedure Laterality Date   CATARACT EXTRACTION W/ INTRAOCULAR LENS  IMPLANT, BILATERAL     COLONOSCOPY     COLONOSCOPY WITH PROPOFOL  N/A 02/24/2019   Procedure: COLONOSCOPY WITH BIOPSY;  Surgeon: Marnee Sink, MD;  Location: [facility];  Service: Endoscopy;  Laterality: N/A;  Sleep apnea Priority 4  ETHMOIDECTOMY Bilateral 04/19/2022   Procedure: ENDOSCOPIC TOTAL ETHMOIDECTOMY WITH FRONTAL SINUSOTOMY;  Surgeon: Mellody Sprout, MD;  Location: [facility];  Service: ENT;  Laterality: Bilateral;   HERNIA REPAIR     IMAGE GUIDED SINUS SURGERY Bilateral 04/19/2022   Procedure: IMAGE GUIDED SINUS SURGERY;  Surgeon: Mellody Sprout, MD;  Location: [facility];  Service: ENT;  Laterality: Bilateral;   MAXILLARY ANTROSTOMY Bilateral 04/19/2022   Procedure: MAXILLARY ANTROSTOMY WITH REMOVAL CONTENTS;  Surgeon: Mellody Sprout, MD;  Location: [facility];  Service: ENT;  Laterality: Bilateral;    POLYPECTOMY N/A 02/24/2019   Procedure: POLYPECTOMY;  Surgeon: Marnee Sink, MD;  Location: [facility];  Service: Endoscopy;  Laterality: N/A;   POLYPECTOMY Left 04/19/2022   Procedure: ENDOSCOPIC POLYPECTOMY NASAL;  Surgeon: Mellody Sprout, MD;  Location: [facility];  Service: ENT;  Laterality: Left;   ROTATOR CUFF REPAIR  03/2011   UVULOPLASTY      SOCIAL HISTORY: Social History   Socioeconomic History   Marital status: Single    Spouse name: Not on file   Number of children: Not on file   Years of education: Not on file   Highest education level: Not on file  Occupational History   Not on file  Tobacco Use   Smoking status: Former    Current packs/day: 0.00    Average packs/day: 0.5 packs/day for 3.0 years (1.5 ttl pk-yrs)    Types: Cigarettes    Start date: 67    Quit date: 67    Years since quitting: 35.3   Smokeless tobacco: Never  Vaping Use   Vaping status: Never Used  Substance and Sexual Activity   Alcohol use: Not Currently   Drug use: Never   Sexual activity: Not on file  Other Topics Concern   Not on file  Social History Narrative   Not on file   Social Drivers of Health   Financial Resource Strain: Low Risk  (04/26/2023)   Received from [facility]   Overall Financial Resource Strain (CARDIA)    Difficulty of Paying Living Expenses: Not hard at all  Food Insecurity: No Food Insecurity (04/26/2023)   Received from [facility]   Hunger Vital Sign    Worried About Running Out of Food in the Last Year: Never true    Ran Out of Food in the Last Year: Never true  Transportation Needs: No Transportation Needs (04/26/2023)   Received from [facility]    In the past 12 months, has lack of transportation kept you from medical appointments or from getting medications?: No    Lack of Transportation (Non-Medical): No  Physical Activity: Not on file  Stress: Not on  file  Social Connections: Not on file  Intimate Partner Violence: Not on file    FAMILY HISTORY: Family History  Problem Relation Age of Onset   Breast cancer Mother    Heart failure Mother    Breast cancer Sister     ALLERGIES:  has no known allergies.  MEDICATIONS:  Current Outpatient Medications  Medication Sig Dispense Refill   amLODipine (NORVASC) 5 MG tablet Take 5 mg by mouth daily.     atorvastatin (LIPITOR) 10 MG tablet Take 10 mg by mouth daily.     dorzolamide-timolol (COSOPT) 22.3-6.8 MG/ML ophthalmic solution Place 1 drop into both eyes 2 (two) times daily.     lisinopril (ZESTRIL) 40 MG tablet Take 40 mg by mouth  daily.     melatonin 5 MG TABS Take 5 mg by mouth.     metoprolol tartrate (LOPRESSOR) 50 MG tablet Take 50 mg by mouth 2 (two) times daily.     naproxen sodium (ALEVE) 220 MG tablet Take 220 mg by mouth 2 (two) times daily as needed.     omeprazole (PRILOSEC) 20 MG capsule Take 20 mg by mouth daily.     zolpidem (AMBIEN CR) 6.25 MG CR tablet Take 6.25 mg by mouth at bedtime as needed for sleep.     No current facility-administered medications for this visit.    Review of Systems  Constitutional:  Negative for appetite change, chills, fatigue, fever and unexpected weight change.  HENT:   Negative for hearing loss and voice change.   Eyes:  Negative for eye problems and icterus.  Respiratory:  Negative for chest tightness, cough and shortness of breath.   Cardiovascular:  Negative for chest pain and leg swelling.  Gastrointestinal:  Negative for abdominal distention and abdominal pain.  Endocrine: Negative for hot flashes.  Genitourinary:  Negative for difficulty urinating, dysuria and frequency.   Musculoskeletal:  Negative for arthralgias.  Skin:  Negative for itching and rash.  Neurological:  Negative for light-headedness and numbness.  Hematological:  Negative for adenopathy. Does not bruise/bleed easily.  Psychiatric/Behavioral:  Negative for  confusion.    PHYSICAL EXAMINATION: ECOG PERFORMANCE STATUS: 0 - Asymptomatic Vitals:   05/09/23 1021  BP: (!) 149/95  Pulse: (!) 50  Resp: 18  Temp: (!) 96.8 F (36 C)  SpO2: 98%   Filed Weights   05/09/23 1021  Weight: 218 lb 12.8 oz (99.2 kg)    Physical Exam Constitutional:      General: He is not in acute distress. HENT:     Head: Normocephalic and atraumatic.  Eyes:     General: No scleral icterus. Cardiovascular:     Rate and Rhythm: Normal rate and regular rhythm.     Heart sounds: Normal heart sounds.  Pulmonary:     Effort: Pulmonary effort is normal. No respiratory distress.     Breath sounds: No wheezing.  Abdominal:     General: Bowel sounds are normal. There is no distension.     Palpations: Abdomen is soft.  Musculoskeletal:        General: No deformity. Normal range of motion.     Cervical back: Normal range of motion and neck supple.  Skin:    General: Skin is warm and dry.     Findings: No erythema or rash.  Neurological:     Mental Status: He is alert and oriented to person, place, and time. Mental status is at baseline.     Cranial Nerves: No cranial nerve deficit.     Coordination: Coordination normal.  Psychiatric:        Mood and Affect: Mood normal.     LABORATORY DATA:  I have reviewed the data as listed    Latest Ref Rng & Units 05/06/2023   10:04 AM 12/31/2022    9:58 AM 08/27/2022    9:21 AM  CBC  WBC 4.0 - 10.5 K/uL 5.9  5.2  5.6   Hemoglobin 13.0 - 17.0 g/dL 16.1  09.6  04.5   Hematocrit 39.0 - 52.0 % 46.7  39.2  42.1   Platelets 150 - 400 K/uL 233  220  193       Latest Ref Rng & Units 05/06/2023   10:04 AM 12/31/2022  9:58 AM 06/22/2022    8:56 AM  CMP  Total Protein 6.5 - 8.1 g/dL 7.2  7.2  7.0   Total Bilirubin 0.0 - 1.2 mg/dL 0.7  0.7  0.8   Alkaline Phos 38 - 126 U/L 57  45  60   AST 15 - 41 U/L 15  21  18    ALT 0 - 44 U/L 21  22  26     09/14/2021, hepatitis panel negative.   RADIOGRAPHIC STUDIES: I have  personally reviewed the radiological images as listed and agreed with the findings in the report. No results found.

## 2023-05-09 NOTE — Assessment & Plan Note (Addendum)
 Homozygous hemochromatosis  Advised patient to have first-degree relative screening for hemochromatosis.  Avoid alcohol consumption.  Avoid uncooked seafood.  US  showed fatty liver disease.  Labs are reviewed and discussed with patient.  Lab Results  Component Value Date   HGB 15.0 05/06/2023   TIBC 329 05/06/2023   IRONPCTSAT 25 05/06/2023   FERRITIN 10 (L) 05/06/2023     Hold off donation for now.  Avoid iron and vitamin C supplementation, food rich in iron and vitamin C.

## 2023-06-29 ENCOUNTER — Ambulatory Visit
Admission: EM | Admit: 2023-06-29 | Discharge: 2023-06-29 | Disposition: A | Discharge status: Home / Self Care | Attending: Family Medicine | Admitting: Family Medicine

## 2023-06-29 DIAGNOSIS — B370 Candidal stomatitis: Secondary | ICD-10-CM | POA: Diagnosis not present

## 2023-06-29 MED ORDER — FLUCONAZOLE 150 MG PO TABS: 150.0000 mg | ORAL_TABLET | ORAL | 0 refills | Status: AC

## 2023-06-29 MED ORDER — NYSTATIN 100000 UNIT/ML MT SUSP: 500000.0000 [IU] | Freq: Four times a day (QID) | OROMUCOSAL | 0 refills | Status: DC

## 2023-06-29 NOTE — ED Triage Notes (Signed)
 Patient has white patches on his tongue. Thinks it may have been there for months.

## 2023-06-29 NOTE — ED Provider Notes (Signed)
 [facility]    CSN: [id number] Arrival date & time: 06/29/23  0930      History   Chief Complaint Chief Complaint  Patient presents with   Thrush    HPI Alexander Brewer. is a 68 y.o. male.   HPI  Alexander Brewer presents for white patches on his tongue for months.  The last 4 days the top of his mouth feels burned. Has sore on his tongue. Tried to scrape some of it off but it didn't take it all the way away.  He uses a CPAP at night.  Doesn't use any inhalers. He is a former smoker. There has been no medication changes or new supplements.  Denies any new foods or drinks.   Follows with ENT and given a Z-pac af couple weeks ago.  He called Dr Maudie office who told him to go to the urgent care to be evaluated.     Past Medical History:  Diagnosis Date   GERD (gastroesophageal reflux disease)    Hypertension    Obstructive sleep apnea treated with bilevel positive airway pressure (BiPAP)    Sleep apnea    CPAP    Patient Active Problem List   Diagnosis Date Noted   Hemochromatosis, hereditary (HCC) 10/16/2021   Fatty liver 10/16/2021   Elevated ferritin 09/19/2021   Family history of hemochromatosis 09/19/2021   History of colonic polyps    Polyp of descending colon     Past Surgical History:  Procedure Laterality Date   CATARACT EXTRACTION W/ INTRAOCULAR LENS  IMPLANT, BILATERAL     COLONOSCOPY     COLONOSCOPY WITH PROPOFOL  N/A 02/24/2019   Procedure: COLONOSCOPY WITH BIOPSY;  Surgeon: Jinny Carmine, MD;  Location: [facility];  Service: Endoscopy;  Laterality: N/A;  Sleep apnea Priority 4   ETHMOIDECTOMY Bilateral 04/19/2022   Procedure: ENDOSCOPIC TOTAL ETHMOIDECTOMY WITH FRONTAL SINUSOTOMY;  Surgeon: Edda Mt, MD;  Location: [facility];  Service: ENT;  Laterality: Bilateral;   HERNIA REPAIR     IMAGE GUIDED SINUS SURGERY Bilateral 04/19/2022   Procedure: IMAGE GUIDED SINUS SURGERY;  Surgeon: Edda Mt, MD;  Location: [facility];  Service: ENT;  Laterality: Bilateral;   MAXILLARY ANTROSTOMY Bilateral 04/19/2022   Procedure: MAXILLARY ANTROSTOMY WITH REMOVAL CONTENTS;  Surgeon: Edda Mt, MD;  Location: [facility];  Service: ENT;  Laterality: Bilateral;   POLYPECTOMY N/A 02/24/2019   Procedure: POLYPECTOMY;  Surgeon: Jinny Carmine, MD;  Location: [facility];  Service: Endoscopy;  Laterality: N/A;   POLYPECTOMY Left 04/19/2022   Procedure: ENDOSCOPIC POLYPECTOMY NASAL;  Surgeon: Edda Mt, MD;  Location: [facility];  Service: ENT;  Laterality: Left;   ROTATOR CUFF REPAIR  03/2011   UVULOPLASTY         Home Medications    Prior to Admission medications   Medication Sig Start Date End Date Taking? Authorizing Provider  amLODipine (NORVASC) 5 MG tablet Take 5 mg by mouth daily.   Yes [provider]  atorvastatin (LIPITOR) 10 MG tablet Take 10 mg by mouth daily. 08/08/21  Yes [provider]  fluconazole (DIFLUCAN) 150 MG tablet Take 1 tablet (150 mg total) by mouth every 3 (three) days for 2 doses. 06/29/23 07/03/23 Yes Ebonee Stober, DO  lisinopril (ZESTRIL) 40 MG tablet Take 40 mg by mouth daily.   Yes [provider]  naproxen sodium (ALEVE) 220 MG tablet Take 220 mg by mouth 2 (two) times daily as needed.   Yes  [provider]  nystatin (MYCOSTATIN) 100000 UNIT/ML suspension Take 5 mLs (500,000 Units total) by mouth 4 (four) times daily. Place one-half of the dose in each side of mouth and retain in mouth as long as possible before swallowing. 06/29/23  Yes Elya Tarquinio, DO  dorzolamide-timolol (COSOPT) 22.3-6.8 MG/ML ophthalmic solution Place 1 drop into both eyes 2 (two) times daily.    [provider]  melatonin 5 MG TABS Take 5 mg by mouth.    [provider]  metoprolol tartrate (LOPRESSOR) 50 MG tablet Take 50 mg by mouth 2 (two) times daily.    [provider]  omeprazole (PRILOSEC) 20 MG capsule Take  20 mg by mouth daily.    [provider]  zolpidem (AMBIEN CR) 6.25 MG CR tablet Take 6.25 mg by mouth at bedtime as needed for sleep.    [provider]    Family History Family History  Problem Relation Age of Onset   Breast cancer Mother    Heart failure Mother    Breast cancer Sister     Social History Social History   Tobacco Use   Smoking status: Former    Current packs/day: 0.00    Average packs/day: 0.5 packs/day for 3.0 years (1.5 ttl pk-yrs)    Types: Cigarettes    Start date: 5    Quit date: 1990    Years since quitting: 35.5   Smokeless tobacco: Never  Vaping Use   Vaping status: Never Used  Substance Use Topics   Alcohol use: Not Currently   Drug use: Never     Allergies   Rosuvastatin   Review of Systems Review of Systems :negative unless otherwise stated in HPI.      Physical Exam Triage Vital Signs ED Triage Vitals  Encounter Vitals Group     BP 06/29/23 0941 (!) 166/95     Girls Systolic BP Percentile --      Girls Diastolic BP Percentile --      Boys Systolic BP Percentile --      Boys Diastolic BP Percentile --      Pulse Rate 06/29/23 0941 (!) 55     Resp 06/29/23 0941 16     Temp 06/29/23 0941 98 F (36.7 C)     Temp Source 06/29/23 0941 Oral     SpO2 06/29/23 0941 94 %     Weight --      Height --      Head Circumference --      Peak Flow --      Pain Score 06/29/23 0940 8     Pain Loc --      Pain Education --      Exclude from Growth Chart --    No data found.  Updated Vital Signs BP (!) 142/90 (BP Location: Right Arm)   Pulse (!) 53   Temp 98 F (36.7 C) (Oral)   Resp 16   SpO2 96%   Visual Acuity Right Eye Distance:   Left Eye Distance:   Bilateral Distance:    Right Eye Near:   Left Eye Near:    Bilateral Near:     Physical Exam  GEN: alert, well appearing male, in no acute distress  HENT: Moist mucous membranes, white plaque on tongue with mucosal white patches and papules on the  bilateral cheeks and roof of mouth CV: bradycardic  RESP: no increased work of breathing SKIN: warm and dry  UC Treatments / Results  Labs (  all labs ordered are listed, but only abnormal results are displayed) Labs Reviewed - No data to display  EKG   Radiology No results found.  Procedures Procedures (including critical care time)  Medications Ordered in UC Medications - No data to display  Initial Impression / Assessment and Plan / UC Course  I have reviewed the triage vital signs and the nursing notes.  Pertinent labs & imaging results that were available during my care of the patient were reviewed by me and considered in my medical decision making (see chart for details).     Patient is a 68 y.o. malewho presents for white lesions in his mouth for months.  Overall, patient is well-appearing and well-hydrated.  Vital signs stable.  Quinterius is afebrile.  Differential diagnosis oral candidiasis, frictional keratosis, chemical burn, hairy leukoplakia,, lichen planus  Exam concerning for thrush but given his history I am unable to rule out cancerous lesion.  Treat with oral Nystatin solution and Diflucan tablets.  If not improving, he will follow-up with his ENT specialist Dr. Juengel.   Reviewed expectations regarding course of current medical issues.  All questions asked were answered.  Outlined signs and symptoms indicating need for more acute intervention. Patient verbalized understanding. After Visit Summary given.   Final Clinical Impressions(s) / UC Diagnoses   Final diagnoses:  Thrush     Discharge Instructions      Stop by the pharmacy to pick up your prescriptions.  Follow up with your ear, nose and throat provider, if not improving.       ED Prescriptions     Medication Sig Dispense Auth. Provider   fluconazole (DIFLUCAN) 150 MG tablet Take 1 tablet (150 mg total) by mouth every 3 (three) days for 2 doses. 2 tablet Grantley Savage, DO   nystatin  (MYCOSTATIN) 100000 UNIT/ML suspension Take 5 mLs (500,000 Units total) by mouth 4 (four) times daily. Place one-half of the dose in each side of mouth and retain in mouth as long as possible before swallowing. 473 mL Samone Guhl, DO      PDMP not reviewed this encounter.              Siddarth Hsiung, DO 06/29/23 1002

## 2023-06-29 NOTE — Discharge Instructions (Signed)
 Stop by the pharmacy to pick up your prescriptions.  Follow up with your ear, nose and throat provider, if not improving.

## 2023-09-16 ENCOUNTER — Inpatient Hospital Stay: Payer: PRIVATE HEALTH INSURANCE | Attending: Oncology

## 2023-09-16 DIAGNOSIS — K760 Fatty (change of) liver, not elsewhere classified: Secondary | ICD-10-CM | POA: Diagnosis not present

## 2023-09-16 DIAGNOSIS — Z87891 Personal history of nicotine dependence: Secondary | ICD-10-CM | POA: Diagnosis not present

## 2023-09-16 LAB — CBC WITH DIFFERENTIAL (CANCER CENTER ONLY)
Abs Immature Granulocytes: 0.03 K/uL (ref 0.00–0.07)
Basophils Absolute: 0.1 K/uL (ref 0.0–0.1)
Basophils Relative: 1 %
Eosinophils Absolute: 0.2 K/uL (ref 0.0–0.5)
Eosinophils Relative: 3 %
HCT: 44.4 % (ref 39.0–52.0)
Hemoglobin: 14.9 g/dL (ref 13.0–17.0)
Immature Granulocytes: 1 %
Lymphocytes Relative: 27 %
Lymphs Abs: 1.7 K/uL (ref 0.7–4.0)
MCH: 29.3 pg (ref 26.0–34.0)
MCHC: 33.6 g/dL (ref 30.0–36.0)
MCV: 87.2 fL (ref 80.0–100.0)
Monocytes Absolute: 0.7 K/uL (ref 0.1–1.0)
Monocytes Relative: 11 %
Neutro Abs: 3.6 K/uL (ref 1.7–7.7)
Neutrophils Relative %: 57 %
Platelet Count: 210 K/uL (ref 150–400)
RBC: 5.09 MIL/uL (ref 4.22–5.81)
RDW: 12.6 % (ref 11.5–15.5)
WBC Count: 6.2 K/uL (ref 4.0–10.5)
nRBC: 0 % (ref 0.0–0.2)

## 2023-09-16 LAB — HEPATIC FUNCTION PANEL
ALT: 15 U/L (ref 0–44)
AST: 14 U/L — ABNORMAL LOW (ref 15–41)
Albumin: 3.9 g/dL (ref 3.5–5.0)
Alkaline Phosphatase: 59 U/L (ref 38–126)
Bilirubin, Direct: <0.1 mg/dL (ref 0.0–0.2)
Total Bilirubin: 0.9 mg/dL (ref 0.0–1.2)
Total Protein: 7 g/dL (ref 6.5–8.1)

## 2023-09-16 LAB — IRON AND TIBC
Iron: 130 ug/dL (ref 45–182)
Saturation Ratios: 44 % — ABNORMAL HIGH (ref 17.9–39.5)
TIBC: 294 ug/dL (ref 250–450)
UIBC: 164 ug/dL

## 2023-09-16 LAB — FERRITIN: Ferritin: 14 ng/mL — ABNORMAL LOW (ref 24–336)

## 2023-09-19 ENCOUNTER — Encounter: Payer: Self-pay | Admitting: Oncology

## 2023-09-19 ENCOUNTER — Inpatient Hospital Stay (HOSPITAL_BASED_OUTPATIENT_CLINIC_OR_DEPARTMENT_OTHER): Payer: PRIVATE HEALTH INSURANCE | Admitting: Oncology

## 2023-09-19 DIAGNOSIS — K760 Fatty (change of) liver, not elsewhere classified: Secondary | ICD-10-CM | POA: Diagnosis not present

## 2023-09-19 NOTE — Assessment & Plan Note (Signed)
 Homozygous hemochromatosis  Advised patient to have first-degree relative screening for hemochromatosis.  Avoid alcohol consumption.  Avoid uncooked seafood.  US  showed fatty liver disease.  Labs are reviewed and discussed with patient.  Lab Results  Component Value Date   HGB 14.9 09/16/2023   TIBC 294 09/16/2023   IRONPCTSAT 44 (H) 09/16/2023   FERRITIN 14 (L) 09/16/2023     Hold off donation for now.  Avoid iron and vitamin C supplementation, food rich in iron and vitamin C.

## 2023-09-19 NOTE — Progress Notes (Signed)
 Hematology/Oncology Progress note [phone] [phone]            Patient Care Team: Jeffie Cheryl BRAVO, MD as PCP - General (Family Medicine) Babara Call, MD as Consulting Physician (Oncology)  ASSESSMENT & PLAN:   Hemochromatosis, hereditary [facility]) Homozygous hemochromatosis  Advised patient to have first-degree relative screening for hemochromatosis.  Avoid alcohol consumption.  Avoid uncooked seafood.  US  showed fatty liver disease.  Labs are reviewed and discussed with patient.  Lab Results  Component Value Date   HGB 14.9 09/16/2023   TIBC 294 09/16/2023   IRONPCTSAT 44 (H) 09/16/2023   FERRITIN 14 (L) 09/16/2023     Hold off donation for now.  Avoid iron and vitamin C supplementation, food rich in iron and vitamin C.     Fatty liver Recommend lifestyle modification.  Increase exercise, healthy diet    Orders Placed This Encounter  Procedures   CBC with Differential (Cancer Center Only)    Standing Status:   Future    Expected Date:   03/18/2024    Expiration Date:   06/16/2024   Iron and TIBC    Standing Status:   Future    Expected Date:   03/18/2024    Expiration Date:   06/16/2024   Ferritin    Standing Status:   Future    Expected Date:   03/18/2024    Expiration Date:   06/16/2024   Hepatic function panel    Standing Status:   Future    Expected Date:   03/18/2024    Expiration Date:   06/16/2024   Follow-up in 6 months All questions were answered. The patient knows to call the clinic with any problems, questions or concerns.  Call Babara, MD, PhD [facility] Hematology Oncology 09/19/2023    CHIEF COMPLAINTS/REASON FOR VISIT:  Follow-up for iron overload, homozygous hemochromatosis  HISTORY OF PRESENTING ILLNESS:   Alexander Salsgiver. is a  68 y.o.  male with PMH listed below was seen in consultation at the request of  Feldpausch, Cheryl BRAVO, MD  for evaluation of  iron overload, homozygous hemochromatosis  Patient reports family  history of hemochromatosis in her sister.  Recent blood work on 09/08/2021 showed ferritin level of 359, iron saturation 98%, TIBC 257.  Calcium 10.8.  Normal AST, ALT, alkaline phosphatase and bilirubin. Patient denies any current alcohol use.  Patient used to drink alcohol heavily in 20s.  Denies oral iron supplementation.  He takes vitamin C 500 mg twice daily for the past year and a half. Patient is prediabetic.  No abdominal pain, unintentional weight loss, bloating.  INTERVAL HISTORY Alexander Akram. is a 68 y.o. male who has above history reviewed by me today presents for follow up visit for  iron overload, homozygous hemochromatosis. Patient had blood work done and present to discuss results No new complaints.   MEDICAL HISTORY:  Past Medical History:  Diagnosis Date   GERD (gastroesophageal reflux disease)    Hypertension    Obstructive sleep apnea treated with bilevel positive airway pressure (BiPAP)    Sleep apnea    CPAP    SURGICAL HISTORY: Past Surgical History:  Procedure Laterality Date   CATARACT EXTRACTION W/ INTRAOCULAR LENS  IMPLANT, BILATERAL     COLONOSCOPY     COLONOSCOPY WITH PROPOFOL  N/A 02/24/2019   Procedure: COLONOSCOPY WITH BIOPSY;  Surgeon: Jinny Carmine, MD;  Location: [facility];  Service: Endoscopy;  Laterality: N/A;  Sleep apnea Priority 4  ETHMOIDECTOMY Bilateral 04/19/2022   Procedure: ENDOSCOPIC TOTAL ETHMOIDECTOMY WITH FRONTAL SINUSOTOMY;  Surgeon: Edda Mt, MD;  Location: [facility];  Service: ENT;  Laterality: Bilateral;   HERNIA REPAIR     IMAGE GUIDED SINUS SURGERY Bilateral 04/19/2022   Procedure: IMAGE GUIDED SINUS SURGERY;  Surgeon: Edda Mt, MD;  Location: [facility];  Service: ENT;  Laterality: Bilateral;   MAXILLARY ANTROSTOMY Bilateral 04/19/2022   Procedure: MAXILLARY ANTROSTOMY WITH REMOVAL CONTENTS;  Surgeon: Edda Mt, MD;  Location: [facility];  Service: ENT;  Laterality: Bilateral;    POLYPECTOMY N/A 02/24/2019   Procedure: POLYPECTOMY;  Surgeon: Jinny Carmine, MD;  Location: [facility];  Service: Endoscopy;  Laterality: N/A;   POLYPECTOMY Left 04/19/2022   Procedure: ENDOSCOPIC POLYPECTOMY NASAL;  Surgeon: Edda Mt, MD;  Location: [facility];  Service: ENT;  Laterality: Left;   ROTATOR CUFF REPAIR  03/2011   UVULOPLASTY      SOCIAL HISTORY: Social History   Socioeconomic History   Marital status: Single    Spouse name: Not on file   Number of children: Not on file   Years of education: Not on file   Highest education level: Not on file  Occupational History   Not on file  Tobacco Use   Smoking status: Former    Current packs/day: 0.00    Average packs/day: 0.5 packs/day for 3.0 years (1.5 ttl pk-yrs)    Types: Cigarettes    Start date: 26    Quit date: 89    Years since quitting: 35.7   Smokeless tobacco: Never  Vaping Use   Vaping status: Never Used  Substance and Sexual Activity   Alcohol use: Not Currently   Drug use: Never   Sexual activity: Not on file  Other Topics Concern   Not on file  Social History Narrative   Not on file   Social Drivers of Health   Financial Resource Strain: Low Risk  (04/26/2023)   Received from [facility]   Overall Financial Resource Strain (CARDIA)    Difficulty of Paying Living Expenses: Not hard at all  Food Insecurity: No Food Insecurity (04/26/2023)   Received from [facility]   Hunger Vital Sign    Within the past 12 months, you worried that your food would run out before you got the money to buy more.: Never true    Within the past 12 months, the food you bought just didn't last and you didn't have money to get more.: Never true  Transportation Needs: No Transportation Needs (04/26/2023)   Received from [facility] - Transportation    In the past 12 months, has lack of transportation kept you from medical appointments or  from getting medications?: No    Lack of Transportation (Non-Medical): No  Physical Activity: Not on file  Stress: Not on file  Social Connections: Not on file  Intimate Partner Violence: Not on file    FAMILY HISTORY: Family History  Problem Relation Age of Onset   Breast cancer Mother    Heart failure Mother    Breast cancer Sister     ALLERGIES:  is allergic to rosuvastatin.  MEDICATIONS:  Current Outpatient Medications  Medication Sig Dispense Refill   amLODipine (NORVASC) 5 MG tablet Take 5 mg by mouth daily.     dorzolamide-timolol (COSOPT) 22.3-6.8 MG/ML ophthalmic solution Place 1 drop into both eyes 2 (two) times daily.     lisinopril (ZESTRIL)  40 MG tablet Take 40 mg by mouth daily.     melatonin 5 MG TABS Take 5 mg by mouth.     metoprolol tartrate (LOPRESSOR) 50 MG tablet Take 50 mg by mouth 2 (two) times daily.     naproxen sodium (ALEVE) 220 MG tablet Take 220 mg by mouth 2 (two) times daily as needed.     omeprazole (PRILOSEC) 20 MG capsule Take 20 mg by mouth daily.     tamsulosin (FLOMAX) 0.4 MG CAPS capsule Take 0.4 mg by mouth daily.     zolpidem (AMBIEN CR) 6.25 MG CR tablet Take 6.25 mg by mouth at bedtime as needed for sleep.     No current facility-administered medications for this visit.    Review of Systems  Constitutional:  Negative for appetite change, chills, fatigue, fever and unexpected weight change.  HENT:   Negative for hearing loss and voice change.   Eyes:  Negative for eye problems and icterus.  Respiratory:  Negative for chest tightness, cough and shortness of breath.   Cardiovascular:  Negative for chest pain and leg swelling.  Gastrointestinal:  Negative for abdominal distention and abdominal pain.  Endocrine: Negative for hot flashes.  Genitourinary:  Negative for difficulty urinating, dysuria and frequency.   Musculoskeletal:  Negative for arthralgias.  Skin:  Negative for itching and rash.  Neurological:  Negative for  light-headedness and numbness.  Hematological:  Negative for adenopathy. Does not bruise/bleed easily.  Psychiatric/Behavioral:  Negative for confusion.    PHYSICAL EXAMINATION: ECOG PERFORMANCE STATUS: 0 - Asymptomatic Vitals:   09/19/23 1012 09/19/23 1020  BP: (!) 155/78 (!) 142/82  Pulse: (!) 41   Resp: 18   Temp: (!) 96 F (35.6 C)   SpO2: 96%    Filed Weights   09/19/23 1012  Weight: 215 lb 8 oz (97.8 kg)    Physical Exam Constitutional:      General: He is not in acute distress. HENT:     Head: Normocephalic and atraumatic.  Eyes:     General: No scleral icterus. Cardiovascular:     Rate and Rhythm: Normal rate and regular rhythm.     Heart sounds: Normal heart sounds.  Pulmonary:     Effort: Pulmonary effort is normal. No respiratory distress.     Breath sounds: No wheezing.  Abdominal:     General: Bowel sounds are normal. There is no distension.     Palpations: Abdomen is soft.  Musculoskeletal:        General: No deformity. Normal range of motion.     Cervical back: Normal range of motion and neck supple.  Skin:    General: Skin is warm and dry.     Findings: No erythema or rash.  Neurological:     Mental Status: He is alert and oriented to person, place, and time. Mental status is at baseline.     Cranial Nerves: No cranial nerve deficit.     Coordination: Coordination normal.  Psychiatric:        Mood and Affect: Mood normal.     LABORATORY DATA:  I have reviewed the data as listed    Latest Ref Rng & Units 09/16/2023   10:30 AM 05/06/2023   10:04 AM 12/31/2022    9:58 AM  CBC  WBC 4.0 - 10.5 K/uL 6.2  5.9  5.2   Hemoglobin 13.0 - 17.0 g/dL 85.0  84.9  87.0   Hematocrit 39.0 - 52.0 % 44.4  46.7  39.2  Platelets 150 - 400 K/uL 210  233  220       Latest Ref Rng & Units 09/16/2023   10:30 AM 05/06/2023   10:04 AM 12/31/2022    9:58 AM  CMP  Total Protein 6.5 - 8.1 g/dL 7.0  7.2  7.2   Total Bilirubin 0.0 - 1.2 mg/dL 0.9  0.7  0.7   Alkaline  Phos 38 - 126 U/L 59  57  45   AST 15 - 41 U/L 14  15  21    ALT 0 - 44 U/L 15  21  22     09/14/2021, hepatitis panel negative.   RADIOGRAPHIC STUDIES: I have personally reviewed the radiological images as listed and agreed with the findings in the report. No results found.

## 2023-09-19 NOTE — Assessment & Plan Note (Signed)
Recommend lifestyle modification.  Increase exercise, healthy diet 

## 2024-03-16 ENCOUNTER — Other Ambulatory Visit: Payer: PRIVATE HEALTH INSURANCE

## 2024-03-19 ENCOUNTER — Ambulatory Visit: Payer: PRIVATE HEALTH INSURANCE | Admitting: Oncology
